# Patient Record
Sex: Female | Born: 1935 | Race: Black or African American | Hispanic: No | Marital: Single | Smoking: Former smoker
Health system: Southern US, Community
[De-identification: ages and names within clinical notes are randomized; demographics above are authoritative.]

## PROBLEM LIST (undated history)

## (undated) DIAGNOSIS — D649 Anemia, unspecified: Secondary | ICD-10-CM

## (undated) DIAGNOSIS — I10 Essential (primary) hypertension: Secondary | ICD-10-CM

## (undated) DIAGNOSIS — E039 Hypothyroidism, unspecified: Secondary | ICD-10-CM

## (undated) DIAGNOSIS — R0602 Shortness of breath: Secondary | ICD-10-CM

## (undated) DIAGNOSIS — K5790 Diverticulosis of intestine, part unspecified, without perforation or abscess without bleeding: Secondary | ICD-10-CM

## (undated) HISTORY — PX: OTHER SURGICAL HISTORY: SHX169

## (undated) HISTORY — DX: Shortness of breath: R06.02

---

## 1984-07-18 HISTORY — PX: ABDOMINAL HYSTERECTOMY: SHX81

## 2003-05-21 HISTORY — PX: OTHER SURGICAL HISTORY: SHX169

## 2010-07-23 ENCOUNTER — Other Ambulatory Visit: Payer: Self-pay | Admitting: Internal Medicine

## 2010-07-23 DIAGNOSIS — Z1231 Encounter for screening mammogram for malignant neoplasm of breast: Secondary | ICD-10-CM

## 2010-08-02 ENCOUNTER — Ambulatory Visit
Admission: RE | Admit: 2010-08-02 | Discharge: 2010-08-02 | Disposition: A | Discharge status: Home / Self Care | Payer: Medicare Other | Source: Ambulatory Visit | Attending: Internal Medicine | Admitting: Internal Medicine

## 2010-08-02 DIAGNOSIS — Z1231 Encounter for screening mammogram for malignant neoplasm of breast: Secondary | ICD-10-CM

## 2010-08-03 ENCOUNTER — Other Ambulatory Visit: Payer: Self-pay | Admitting: Internal Medicine

## 2010-08-03 DIAGNOSIS — R928 Other abnormal and inconclusive findings on diagnostic imaging of breast: Secondary | ICD-10-CM

## 2010-08-14 ENCOUNTER — Ambulatory Visit
Admission: RE | Admit: 2010-08-14 | Discharge: 2010-08-14 | Disposition: A | Discharge status: Home / Self Care | Payer: Medicare Other | Source: Ambulatory Visit | Attending: Internal Medicine | Admitting: Internal Medicine

## 2010-08-14 DIAGNOSIS — R928 Other abnormal and inconclusive findings on diagnostic imaging of breast: Secondary | ICD-10-CM

## 2011-10-11 ENCOUNTER — Encounter (HOSPITAL_COMMUNITY): Payer: Self-pay

## 2011-10-11 ENCOUNTER — Inpatient Hospital Stay (HOSPITAL_COMMUNITY)
Admission: EM | Admit: 2011-10-11 | Discharge: 2011-10-14 | DRG: 378 | Disposition: A | Discharge status: Home / Self Care | Payer: Medicare Other | Attending: Internal Medicine | Admitting: Internal Medicine

## 2011-10-11 ENCOUNTER — Emergency Department (INDEPENDENT_AMBULATORY_CARE_PROVIDER_SITE_OTHER)
Admission: EM | Admit: 2011-10-11 | Discharge: 2011-10-11 | Disposition: A | Discharge status: Other Acute Inpatient Hospital | Payer: Medicare Other | Source: Home / Self Care | Attending: Emergency Medicine | Admitting: Emergency Medicine

## 2011-10-11 ENCOUNTER — Encounter (HOSPITAL_COMMUNITY): Payer: Self-pay | Admitting: *Deleted

## 2011-10-11 DIAGNOSIS — D649 Anemia, unspecified: Secondary | ICD-10-CM

## 2011-10-11 DIAGNOSIS — E039 Hypothyroidism, unspecified: Secondary | ICD-10-CM | POA: Diagnosis present

## 2011-10-11 DIAGNOSIS — E119 Type 2 diabetes mellitus without complications: Secondary | ICD-10-CM | POA: Diagnosis present

## 2011-10-11 DIAGNOSIS — Z860100 Personal history of colon polyps, unspecified: Secondary | ICD-10-CM

## 2011-10-11 DIAGNOSIS — Z6835 Body mass index (BMI) 35.0-35.9, adult: Secondary | ICD-10-CM

## 2011-10-11 DIAGNOSIS — K625 Hemorrhage of anus and rectum: Secondary | ICD-10-CM

## 2011-10-11 DIAGNOSIS — K922 Gastrointestinal hemorrhage, unspecified: Secondary | ICD-10-CM

## 2011-10-11 DIAGNOSIS — D126 Benign neoplasm of colon, unspecified: Secondary | ICD-10-CM | POA: Diagnosis present

## 2011-10-11 DIAGNOSIS — I10 Essential (primary) hypertension: Secondary | ICD-10-CM

## 2011-10-11 DIAGNOSIS — K648 Other hemorrhoids: Secondary | ICD-10-CM | POA: Diagnosis present

## 2011-10-11 DIAGNOSIS — D62 Acute posthemorrhagic anemia: Secondary | ICD-10-CM | POA: Diagnosis present

## 2011-10-11 DIAGNOSIS — K5731 Diverticulosis of large intestine without perforation or abscess with bleeding: Principal | ICD-10-CM | POA: Diagnosis present

## 2011-10-11 DIAGNOSIS — E785 Hyperlipidemia, unspecified: Secondary | ICD-10-CM | POA: Diagnosis present

## 2011-10-11 DIAGNOSIS — Z90710 Acquired absence of both cervix and uterus: Secondary | ICD-10-CM

## 2011-10-11 HISTORY — DX: Anemia, unspecified: D64.9

## 2011-10-11 HISTORY — DX: Diverticulosis of intestine, part unspecified, without perforation or abscess without bleeding: K57.90

## 2011-10-11 HISTORY — DX: Essential (primary) hypertension: I10

## 2011-10-11 LAB — CBC
HCT: 33.6 % — ABNORMAL LOW (ref 36.0–46.0)
Hemoglobin: 10.5 g/dL — ABNORMAL LOW (ref 12.0–15.0)
Hemoglobin: 10.7 g/dL — ABNORMAL LOW (ref 12.0–15.0)
MCH: 28.5 pg (ref 26.0–34.0)
MCV: 88.6 fL (ref 78.0–100.0)
Platelets: 255 10*3/uL (ref 150–400)
RBC: 3.68 MIL/uL — ABNORMAL LOW (ref 3.87–5.11)
RBC: 3.73 MIL/uL — ABNORMAL LOW (ref 3.87–5.11)
RDW: 14.8 % (ref 11.5–15.5)
WBC: 7.6 10*3/uL (ref 4.0–10.5)
WBC: 7.8 10*3/uL (ref 4.0–10.5)

## 2011-10-11 LAB — OCCULT BLOOD, POC DEVICE
Fecal Occult Bld: POSITIVE
Fecal Occult Bld: POSITIVE

## 2011-10-11 LAB — PROTIME-INR: Prothrombin Time: 13.3 seconds (ref 11.6–15.2)

## 2011-10-11 LAB — POCT I-STAT, CHEM 8
BUN: 10 mg/dL (ref 6–23)
Chloride: 110 mEq/L (ref 96–112)
Potassium: 4.3 mEq/L (ref 3.5–5.1)
Sodium: 143 mEq/L (ref 135–145)

## 2011-10-11 LAB — URINALYSIS, ROUTINE W REFLEX MICROSCOPIC
Glucose, UA: NEGATIVE mg/dL
Protein, ur: 100 mg/dL — AB
Specific Gravity, Urine: 1.027 (ref 1.005–1.030)
pH: 5 (ref 5.0–8.0)

## 2011-10-11 LAB — COMPREHENSIVE METABOLIC PANEL
ALT: 10 U/L (ref 0–35)
ALT: 11 U/L (ref 0–35)
AST: 18 U/L (ref 0–37)
Albumin: 3.8 g/dL (ref 3.5–5.2)
Alkaline Phosphatase: 78 U/L (ref 39–117)
CO2: 25 mEq/L (ref 19–32)
Chloride: 106 mEq/L (ref 96–112)
Creatinine, Ser: 1.02 mg/dL (ref 0.50–1.10)
GFR calc non Af Amer: 52 mL/min — ABNORMAL LOW (ref >90)
Potassium: 3.7 mEq/L (ref 3.5–5.1)
Sodium: 139 mEq/L (ref 135–145)
Total Bilirubin: 0.3 mg/dL (ref 0.3–1.2)
Total Protein: 7.6 g/dL (ref 6.0–8.3)

## 2011-10-11 LAB — DIFFERENTIAL
Basophils Absolute: 0 10*3/uL (ref 0.0–0.1)
Lymphocytes Relative: 39 % (ref 12–46)
Monocytes Absolute: 0.7 10*3/uL (ref 0.1–1.0)
Neutro Abs: 3.6 10*3/uL (ref 1.7–7.7)

## 2011-10-11 LAB — ABO/RH: ABO/RH(D): A POS

## 2011-10-11 LAB — URINE MICROSCOPIC-ADD ON

## 2011-10-11 LAB — GLUCOSE, CAPILLARY: Glucose-Capillary: 90 mg/dL (ref 70–99)

## 2011-10-11 LAB — SAMPLE TO BLOOD BANK

## 2011-10-11 LAB — TYPE AND SCREEN

## 2011-10-11 MED ORDER — LEVOTHYROXINE SODIUM 75 MCG PO TABS
75.0000 ug | ORAL_TABLET | Freq: Every day | ORAL | Status: DC
  Administered 2011-10-12 – 2011-10-14 (×3): 75 ug via ORAL
  Filled 2011-10-11 (×3): qty 1

## 2011-10-11 MED ORDER — SODIUM CHLORIDE 0.9 % IV SOLN
INTRAVENOUS | Status: AC
  Administered 2011-10-11: via INTRAVENOUS

## 2011-10-11 MED ORDER — ATORVASTATIN CALCIUM 40 MG PO TABS
40.0000 mg | ORAL_TABLET | Freq: Every day | ORAL | Status: DC
  Filled 2011-10-11 (×3): qty 1

## 2011-10-11 MED ORDER — CARVEDILOL 12.5 MG PO TABS
12.5000 mg | ORAL_TABLET | Freq: Two times a day (BID) | ORAL | Status: DC
  Filled 2011-10-11 (×3): qty 1

## 2011-10-11 MED ORDER — ONDANSETRON HCL 4 MG/2ML IJ SOLN: 4.0000 mg | Freq: Four times a day (QID) | INTRAMUSCULAR | Status: DC | PRN

## 2011-10-11 MED ORDER — SODIUM CHLORIDE 0.9 % IJ SOLN
3.0000 mL | Freq: Two times a day (BID) | INTRAMUSCULAR | Status: DC
  Administered 2011-10-12: 3 mL via INTRAVENOUS

## 2011-10-11 MED ORDER — HYDROCODONE-ACETAMINOPHEN 5-325 MG PO TABS: 1.0000 | ORAL_TABLET | ORAL | Status: DC | PRN

## 2011-10-11 MED ORDER — INSULIN ASPART 100 UNIT/ML ~~LOC~~ SOLN: 0.0000 [IU] | SUBCUTANEOUS | Status: DC

## 2011-10-11 MED ORDER — ACETAMINOPHEN 650 MG RE SUPP: 650.0000 mg | Freq: Four times a day (QID) | RECTAL | Status: DC | PRN

## 2011-10-11 MED ORDER — ACETAMINOPHEN 325 MG PO TABS: 650.0000 mg | ORAL_TABLET | Freq: Four times a day (QID) | ORAL | Status: DC | PRN

## 2011-10-11 MED ORDER — ONDANSETRON HCL 4 MG PO TABS: 4.0000 mg | ORAL_TABLET | Freq: Four times a day (QID) | ORAL | Status: DC | PRN

## 2011-10-11 MED ORDER — GUAIFENESIN-DM 100-10 MG/5ML PO SYRP
5.0000 mL | ORAL_SOLUTION | ORAL | Status: DC | PRN
  Filled 2011-10-11: qty 5

## 2011-10-11 MED ORDER — ALBUTEROL SULFATE (5 MG/ML) 0.5% IN NEBU: 2.5000 mg | INHALATION_SOLUTION | RESPIRATORY_TRACT | Status: DC | PRN

## 2011-10-11 MED ORDER — PANTOPRAZOLE SODIUM 40 MG IV SOLR
40.0000 mg | Freq: Every day | INTRAVENOUS | Status: DC
  Administered 2011-10-12: 40 mg via INTRAVENOUS
  Filled 2011-10-11 (×3): qty 40

## 2011-10-11 NOTE — ED Notes (Signed)
PT reports bright red blood in stool after eatting spicy food. No other complaints.

## 2011-10-11 NOTE — ED Provider Notes (Signed)
I saw and evaluated the patient, reviewed the resident's note and I agree with the findings and plan.  Pt seen and evaluated, hx of diverticulosis with bright red blood per rectum- she will be admitted to stepdown bed, [facility] has seen in the ED  Threasa Beards, MD 10/11/11 2242

## 2011-10-11 NOTE — ED Provider Notes (Signed)
Chief Complaint  Patient presents with  . Rectal Bleeding    History of Present Illness:   The patient is a 78 year old female who has had a three-day history of passage of large amounts of bright red blood per rectum. The blood is mixed with the stool. Stools have been somewhat loose and dark. She estimates having passed 1 pint of blood in total. She denies any rectal pain, itching, or irritation. She has not had abdominal pain, fever, chills, nausea, or vomiting. She feels a little bit weak and tired and rundown and mildly short of breath. She has a history of diverticulosis in the past but no history of diverticulitis or diverticular bleeding. She had a colonoscopy in 2007 in [place] which was normal except for diverticulosis. She has diabetes and hypothyroidism. She lives alone. Her nearest relative is her daughter who lives out of town. She has no history of ulcer disease or GI bleeding in the past.  Review of Systems:  Other than noted above, the patient denies any of the following symptoms: Constitutional:  No fever, chills, fatigue, weight loss or anorexia. Lungs:  No cough or shortness of breath. Heart:  No chest pain, palpitations, syncope or edema.  No cardiac history. Abdomen:  No nausea, vomiting, hematememesis, melena, diarrhea, or hematochezia. GU:  No dysuria, frequency, urgency, or hematuria. Gyn:  No vaginal discharge, itching, abnormal bleeding or pelvic pain. Skin:  No rash or itching.  [place]:  Past medical history, family history, social history, meds, and allergies were reviewed.  No prior abdominal surgeries, past history of GI problems, STDs or GYN problems.  No history of aspirin or NSAID use.  No excessive alcohol intake.  Physical Exam:   Vital signs:  BP 122/64  Pulse 108  Temp(Src) 98.2 F (36.8 C) (Oral)  Resp 12  SpO2 98% Filed Vitals:   10/11/11 1532 10/11/11 1731 10/11/11 1732 10/11/11 1733  BP: 153/83 127/85 supine 122/80 sitting 122/64 standing    Pulse: 103 80 87 108  Temp: 98.2 F (36.8 C)     TempSrc: Oral     Resp: 12     SpO2: 98%      Gen:  Alert, oriented, in no distress. Lungs:  Breath sounds clear and equal bilaterally.  No wheezes, rales or rhonchi. Heart:  Regular rhythm.  No gallops or murmers.   Abdomen:  Abdomen is soft, flat, and nondistended. There is no tenderness, guarding, rebound. No organomegaly or mass. No pulsatile midline abdominal mass or bruit. Bowel sounds are normal. Skin:  Clear, warm and dry.  No rash.  Procedure Note:  Verbal informed consent was obtained from the patient.  Risks and benefits were outlined with the patient.  Patient understands and accepts these risks.  Identity of the patient was confirmed verbally and by armband.    Procedure was performed as followed:  Endoscopy was done with the patient in the right lateral decubitus position. She had some external hemorrhoid tags. In scope was inserted to the depth of 1 inch. She had no hemorrhoids or fissures. There was a large amount of bleeding in the rectal ampulla with dark, red, clotted blood. Digital rectal exam reveals no pain, no masses, and gross blood on the exam glove which was heme positive.  Patient tolerated the procedure well without any immediate complications.   Labs:   Results for orders placed during the hospital encounter of 10/11/11  OCCULT BLOOD, POC DEVICE      Component Value Range   Fecal  Occult Bld POSITIVE    POCT I-STAT, CHEM 8      Component Value Range   Sodium 143  135 - 145 (mEq/L)   Potassium 4.3  3.5 - 5.1 (mEq/L)   Chloride 110  96 - 112 (mEq/L)   BUN 10  6 - 23 (mg/dL)   Creatinine, Ser 1.10  0.50 - 1.10 (mg/dL)   Glucose, Bld 98  70 - 99 (mg/dL)   Calcium, Ion 1.53 (*) 1.12 - 1.32 (mmol/L)   TCO2 25  0 - 100 (mmol/L)   Hemoglobin 13.3  12.0 - 15.0 (g/dL)   HCT 39.0  36.0 - 46.0 (%)    Assessment:  The encounter diagnosis was Lower GI bleed. This is probably diverticular bleeding.   Plan:   1.   The following meds were prescribed:   New Prescriptions   No medications on file   2.  The patient was transported to the emergency department via shuttle.  Harden Mo, MD 10/11/11 [phone]

## 2011-10-11 NOTE — ED Notes (Signed)
C/o 2-3 day duration of painless bleeding w loose stools; reported colon polyp history from MD in [place] a few years ago; NAD, anxious

## 2011-10-11 NOTE — ED Provider Notes (Signed)
History     CSN: [id number]  Arrival date & time 10/11/11  1801   First MD Initiated Contact with Patient 10/11/11 1930      Chief Complaint  Patient presents with  . Rectal Bleeding    (Consider location/radiation/quality/duration/timing/severity/associated sxs/prior treatment) HPI Comments: Blood in stool since yesterday.  Sent from urgent care for eval.  On ASA.  No lightheadedness.  Otherwise doing well.  Patient is a 78 y.o. female presenting with hematochezia. The history is provided by the patient.  Rectal Bleeding  The current episode started yesterday. The onset was gradual. The problem occurs continuously. The problem has been unchanged. The patient is experiencing no pain. The stool is described as bloody, mixed with blood and soft. There was no prior successful therapy. There was no prior unsuccessful therapy. Pertinent negatives include no abdominal pain, no hemorrhoids, no nausea, no rectal pain, no vomiting, no chest pain and no rash. She has been behaving normally. She has been eating and drinking normally.    Past Medical History  Diagnosis Date  . Hypertension   . Diabetes mellitus   . Diverticulosis   . Anemia     Past Surgical History  Procedure Date  . Abdominal hysterectomy     Family History  Problem Relation Age of Onset  . Cancer Mother   . Diabetes Maternal Uncle     History  Substance Use Topics  . Smoking status: Never Smoker   . Smokeless tobacco: Not on file  . Alcohol Use: No    OB History    Grav Para Term Preterm Abortions TAB SAB Ect Mult Living                  Review of Systems  Constitutional: Negative for activity change.  HENT: Negative for congestion.   Eyes: Negative for visual disturbance.  Respiratory: Negative for chest tightness and shortness of breath.   Cardiovascular: Negative for chest pain and leg swelling.  Gastrointestinal: Positive for hematochezia. Negative for nausea, vomiting, abdominal pain, rectal  pain and hemorrhoids.  Genitourinary: Negative for dysuria.  Skin: Negative for rash.  Neurological: Negative for syncope.  Psychiatric/Behavioral: Negative for behavioral problems.    Allergies  Review of patient's allergies indicates no known allergies.  Home Medications   No current outpatient prescriptions on file.  BP 154/73  Pulse 79  Temp(Src) 97.9 F (36.6 C) (Oral)  Resp 20  SpO2 95%  Physical Exam  Constitutional: She is oriented to person, place, and time. She appears well-developed and well-nourished.  HENT:  Head: Normocephalic and atraumatic.  Eyes: Conjunctivae and EOM are normal. Pupils are equal, round, and reactive to light. No scleral icterus.  Neck: Normal range of motion. Neck supple.  Cardiovascular: Normal rate and regular rhythm.  Exam reveals no gallop and no friction rub.   No murmur heard. Pulmonary/Chest: Effort normal and breath sounds normal. No respiratory distress. She has no wheezes. She has no rales. She exhibits no tenderness.  Abdominal: Soft. She exhibits no distension and no mass. There is no tenderness. There is no rebound and no guarding.  Genitourinary:       Bright blood with stool.  No pain.  No hemorrhoids.  Musculoskeletal: Normal range of motion.  Neurological: She is alert and oriented to person, place, and time. She has normal reflexes. No cranial nerve deficit.  Skin: Skin is warm and dry. No rash noted.  Psychiatric: She has a normal mood and affect. Her behavior is normal. Judgment  and thought content normal.    ED Course  Procedures (including critical care time)  Labs Reviewed  URINALYSIS, ROUTINE W REFLEX MICROSCOPIC - Abnormal; Notable for the following:    Bilirubin Urine SMALL (*)    Ketones, ur 15 (*)    Protein, ur 100 (*)    Leukocytes, UA SMALL (*)    All other components within normal limits  CBC - Abnormal; Notable for the following:    RBC 3.73 (*)    Hemoglobin 10.7 (*) DELTA CHECK NOTED   HCT 33.6  (*)    All other components within normal limits  COMPREHENSIVE METABOLIC PANEL - Abnormal; Notable for the following:    Calcium 10.7 (*)    GFR calc non Af Amer 52 (*)    GFR calc Af Amer 61 (*)    All other components within normal limits  URINE MICROSCOPIC-ADD ON - Abnormal; Notable for the following:    Casts HYALINE CASTS (*) GRANULAR CAST   All other components within normal limits  COMPREHENSIVE METABOLIC PANEL - Abnormal; Notable for the following:    Total Bilirubin 0.2 (*)    GFR calc non Af Amer 52 (*)    GFR calc Af Amer 60 (*)    All other components within normal limits  DIFFERENTIAL  SAMPLE TO BLOOD BANK  LACTIC ACID, PLASMA  TYPE AND SCREEN  PROTIME-INR  OCCULT BLOOD, POC DEVICE  ABO/RH   No results found.   1. Rectal bleed   2. Anemia       MDM  Blood in stool since yesterday.  Sent from urgent care for eval.  On ASA.  No lightheadedness.  Otherwise doing well.  VSS and well appearing.  BRBPR on exam.  No abdominal pain.  Exam suggestive of lower GI bleed.  D/w medicine - will admit for further management.        Dyke Brackett, MD 10/11/11 2238

## 2011-10-11 NOTE — Discharge Instructions (Signed)
We have determined that your problem requires further evaluation in the emergency department.  We will take care of your transport there.  Once at the emergency department, you will be evaluated by a provider and they will order whatever treatment or tests they deem necessary.  We cannot guarantee that they will do any specific test or do any specific treatment.    Gastrointestinal Bleeding Gastrointestinal (GI) bleeding is bleeding from the gut or any place between your mouth and anus. If bleeding is slow, you may be allowed to go home. If there is a lot of bleeding, hospitalization and observation are often required. SYMPTOMS   You vomit bright red blood or material that looks like coffee grounds.   You have blood in your stools or the stools look black and tarry.  DIAGNOSIS  Your caregiver may diagnose your condition by taking a history and a physical exam. More tests may be needed, including:  X-rays.   EGD (esophagogastroduodenoscopy), which looks at your esophagus, stomach, and small bowel through a flexible telescope-like instrument.   Colonoscopy, which looks at your colon/large bowel through a flexible telescope-like instrument.   Biopsies, which remove a small sample of tissue to examine under a microscope.  Finding out the results of your test Not all test results are available during your visit. If your test results are not back during the visit, make an appointment with your caregiver to find out the results. Do not assume everything is normal if you have not heard from your caregiver or the medical facility. It is important for you to follow up on all of your test results. HOME CARE INSTRUCTIONS   Follow instructions as suggested by your caregiver regarding medicines. Do not take aspirin, drink alcohol, or take medicines for pain and arthritis unless your caregiver says it is okay.   Get the suggested follow-up care when the tests are done.  SEEK IMMEDIATE MEDICAL CARE IF:     Your bleeding increases or you become lightheaded, weak, or pass out (faint).   You experience severe cramps in your stomach, back, or belly (abdomen).   You pass large clots.   The problems which brought you in for medical care get worse.  MAKE SURE YOU:   Understand these instructions.   Will watch your condition.   Will get help right away if you are not doing well or get worse.  Document Released: 05/03/2000 Document Revised: 04/25/2011 Document Reviewed: 04/15/2011 [facility] Patient Information 2012 [place], [place].

## 2011-10-11 NOTE — ED Notes (Signed)
The pt was a ucc transfer.  No history of hemorrhoids.  The blood is mixed in with the stools

## 2011-10-11 NOTE — H&P (Signed)
PCP:   Foye Spurling, MD, MD    Chief Complaint:   Bloody stools  HPI: Alexa Lawson is a 78 y.o. female   has a past medical history of Hypertension; Diabetes mellitus; Diverticulosis; and Anemia.   Presented with  Since Wednesday she had blood in stool. She had 1 episode on Wednesday 2 yesterday and again an other epiosode today. She went  To urgent care and was found to be anemic and tachycardic and sent to ER. While at urgent care the hemoglobin went from 13.3 to 10.7.  Last colonoscopy was in 2007 done in [place] showed diverticulosis and some polyps that were removed. She was supposed to come back next year but never did. Patient reports No abdominal pain, no epigastric pain. She has been taking full dose aspirin but not sure why. No history of melena.  Review of Systems:    Pertinent positives include: occasional dyspnea on exertion, blood in stool,  Constitutional:  No weight loss, night sweats, Fevers, chills, fatigue, weight loss  HEENT:  No headaches, Difficulty swallowing,Tooth/dental problems,Sore throat,  No sneezing, itching, ear ache, nasal congestion, post nasal drip,  Cardio-vascular:  No chest pain, Orthopnea, PND, anasarca, dizziness, palpitations.no Bilateral lower extremity swelling  GI:  No heartburn, indigestion, abdominal pain, nausea, vomiting, diarrhea, change in bowel habits, loss of appetite, melena,  hematemesis Resp: no shortness of breath at rest. No , No excess mucus, no productive cough, No non-productive cough, No coughing up of blood.No change in color of mucus.No wheezing. Skin:  no rash or lesions. No jaundice GU:  no dysuria, change in color of urine, no urgency or frequency. No straining to urinate.  No flank pain.  Musculoskeletal:  No joint pain or no joint swelling. No decreased range of motion. No back pain.  Psych:  No change in mood or affect. No depression or anxiety. No memory loss.  Neuro: no localizing neurological  complaints, no tingling, no weakness, no double vision, no gait abnormality, no slurred speech, no confusion  Otherwise ROS are negative except for above, 10 systems were reviewed  Past Medical History: Past Medical History  Diagnosis Date  . Hypertension   . Diabetes mellitus   . Diverticulosis   . Anemia    Past Surgical History  Procedure Date  . Abdominal hysterectomy      Medications: Prior to Admission medications   Medication Sig Start Date End Date Taking? Authorizing Provider  amLODipine-valsartan (EXFORGE) 5-320 MG per tablet Take 1 tablet by mouth daily.   Yes Historical Provider, MD  aspirin 325 MG EC tablet Take 325 mg by mouth daily.   Yes Historical Provider, MD  carvedilol (COREG) 12.5 MG tablet Take 12.5 mg by mouth 2 (two) times daily with a meal.   Yes Historical Provider, MD  levothyroxine (SYNTHROID, LEVOTHROID) 75 MCG tablet Take 75 mcg by mouth daily.   Yes Historical Provider, MD  metFORMIN (GLUCOPHAGE) 1000 MG tablet Take 1,000 mg by mouth daily with breakfast.    Yes Historical Provider, MD  rosuvastatin (CRESTOR) 20 MG tablet Take 20 mg by mouth daily.   Yes Historical Provider, MD  Vitamin D, Ergocalciferol, (DRISDOL) 50000 UNITS CAPS Take 50,000 Units by mouth every 14 (fourteen) days.    Yes Historical Provider, MD    Allergies:  No Known Allergies  Social History:  Ambulatory  independently Lives at  Home alone   reports that she has never smoked. She does not have any smokeless tobacco history on file. She reports  that she does not drink alcohol or use illicit drugs.   Family History: family history includes Cancer in her mother and Diabetes in her maternal uncle.    Physical Exam: Patient Vitals for the past 24 hrs:  BP Temp Temp src Pulse Resp SpO2  10/11/11 2015 149/86 mmHg - - 84  - 96 %  10/11/11 1945 144/71 mmHg - - 90  - 95 %  10/11/11 1804 155/70 mmHg 97.9 F (36.6 C) Oral 88  20  100 %    1. General:  in No Acute  distress 2. Psychological: Alert and  Oriented 3. Head/ENT:    Dry Mucous Membranes                          Head Non traumatic, neck supple                          Normal Dentition 4. SKIN: normal  Skin turgor,  Skin clean Dry and intact no rash 5. Heart: Regular rate and rhythm no Murmur, Rub or gallop 6. Lungs: Clear to auscultation bilaterally, no wheezes or crackles   7. Abdomen: Soft, non-tender, Non distended slightly obese 8. Lower extremities: no clubbing, cyanosis, or edema 9. Neurologically Grossly intact, moving all 4 extremities equally 10. MSK: Normal range of motion  body mass index is unknown because there is no height or weight on file.   Labs on Admission:   [facility] 10/11/11 1956 10/11/11 1846  NA 139 139  K 3.7 3.9  CL 106 106  CO2 23 25  GLUCOSE 89 91  BUN 9 9  CREATININE 1.03 1.02  CALCIUM 10.3 10.7*  MG -- --  PHOS -- --    Basename 10/11/11 1956 10/11/11 1846  AST 18 18  ALT 11 10  ALKPHOS 78 82  BILITOT 0.2* 0.3  PROT 7.6 7.8  ALBUMIN 3.8 4.0   No results found for this basename: LIPASE:2,AMYLASE:2 in the last 72 hours  Basename 10/11/11 1846 10/11/11 1734  WBC 7.6 --  NEUTROABS 3.6 --  HGB 10.7* 13.3  HCT 33.6* 39.0  MCV 90.1 --  PLT 275 --   No results found for this basename: CKTOTAL:3,CKMB:3,CKMBINDEX:3,TROPONINI:3 in the last 72 hours No results found for this basename: TSH,T4TOTAL,FREET3,T3FREE,THYROIDAB in the last 72 hours No results found for this basename: VITAMINB12:2,FOLATE:2,FERRITIN:2,TIBC:2,IRON:2,RETICCTPCT:2 in the last 72 hours No results found for this basename: HGBA1C    CrCl is unknown because there is no height on file for the current visit. ABG    Component Value Date/Time   TCO2 25 10/11/2011 1734     Lactic Acid, Venous 1.5   Cultures: No results found for this basename: sdes, specrequest, cult, reptstatus       Radiological Exams on Admission: No results found.  Assessment/Plan  This is a  78 year old female with likely lower GI bleeding. Although she has been taking full dose aspirin but her story is not consistent upper GI source  Present on Admission:  .Blood in stool - we'll admit to step down monitor carefully obtain serial CBCs, type and screen already ordered by ER. Have spoken to Dr. Collene Mares GI will come and see the patient tomorrow meanwhile clear liquids make n.p.o. post in case she needs any studies  .HTN (hypertension) - will hold Norvasc but continue carvediolol .Anemia -  the patient states she had history of anemia in the past but this was never worked  up. Initial  hemoglobin today was 13 I'm not sure how accurate it is the repeat hemoglobin just a few hours was significantly lower   diabetes  - will hold metformin and put on sliding scale    Prophylaxis: SCD Protonix  CODE STATUS: FULL CODE  I have spent a total of 65 min on this admission, time taken speaking to consultants   [place] 10/11/2011, 8:43 PM

## 2011-10-11 NOTE — ED Notes (Signed)
The pt has had some bright red blood in her stools since Wednesday night.  No pain.  No previous hisotry

## 2011-10-12 DIAGNOSIS — D649 Anemia, unspecified: Secondary | ICD-10-CM

## 2011-10-12 DIAGNOSIS — E119 Type 2 diabetes mellitus without complications: Secondary | ICD-10-CM

## 2011-10-12 DIAGNOSIS — K922 Gastrointestinal hemorrhage, unspecified: Secondary | ICD-10-CM

## 2011-10-12 LAB — CBC
HCT: 32 % — ABNORMAL LOW (ref 36.0–46.0)
HCT: 31.5 % — ABNORMAL LOW (ref 36.0–46.0)
HCT: 31.7 % — ABNORMAL LOW (ref 36.0–46.0)
Hemoglobin: 10.2 g/dL — ABNORMAL LOW (ref 12.0–15.0)
Hemoglobin: 10.1 g/dL — ABNORMAL LOW (ref 12.0–15.0)
Hemoglobin: 10.2 g/dL — ABNORMAL LOW (ref 12.0–15.0)
MCH: 28.7 pg (ref 26.0–34.0)
MCHC: 31.9 g/dL (ref 30.0–36.0)
MCV: 89.3 fL (ref 78.0–100.0)
RBC: 3.52 MIL/uL — ABNORMAL LOW (ref 3.87–5.11)
RBC: 3.55 MIL/uL — ABNORMAL LOW (ref 3.87–5.11)
RDW: 14.7 % (ref 11.5–15.5)
WBC: 7.8 10*3/uL (ref 4.0–10.5)
WBC: 7.6 10*3/uL (ref 4.0–10.5)

## 2011-10-12 LAB — GLUCOSE, CAPILLARY
Glucose-Capillary: 90 mg/dL (ref 70–99)
Glucose-Capillary: 89 mg/dL (ref 70–99)
Glucose-Capillary: 85 mg/dL (ref 70–99)

## 2011-10-12 LAB — COMPREHENSIVE METABOLIC PANEL
BUN: 9 mg/dL (ref 6–23)
CO2: 22 mEq/L (ref 19–32)
Calcium: 10.1 mg/dL (ref 8.4–10.5)
Creatinine, Ser: 0.91 mg/dL (ref 0.50–1.10)
GFR calc Af Amer: 70 mL/min — ABNORMAL LOW (ref >90)
GFR calc non Af Amer: 60 mL/min — ABNORMAL LOW (ref >90)
Glucose, Bld: 86 mg/dL (ref 70–99)
Total Protein: 6.8 g/dL (ref 6.0–8.3)

## 2011-10-12 LAB — HEMOGLOBIN A1C
Hgb A1c MFr Bld: 6.3 % — ABNORMAL HIGH (ref <5.7)
Mean Plasma Glucose: 134 mg/dL — ABNORMAL HIGH (ref <117)

## 2011-10-12 LAB — IRON AND TIBC
Iron: 67 ug/dL (ref 42–135)
Saturation Ratios: 26 % (ref 20–55)
TIBC: 260 ug/dL (ref 250–470)

## 2011-10-12 LAB — VITAMIN B12: Vitamin B-12: 603 pg/mL (ref 211–911)

## 2011-10-12 LAB — MRSA PCR SCREENING: MRSA by PCR: NEGATIVE

## 2011-10-12 LAB — RETICULOCYTES: Retic Ct Pct: 1.5 % (ref 0.4–3.1)

## 2011-10-12 MED ORDER — PEG 3350-KCL-NA BICARB-NACL 420 G PO SOLR
4000.0000 mL | Freq: Once | ORAL | Status: AC
  Administered 2011-10-12: 4000 mL via ORAL
  Filled 2011-10-12: qty 4000

## 2011-10-12 MED ORDER — SODIUM CHLORIDE 0.9 % IV SOLN
INTRAVENOUS | Status: DC
  Administered 2011-10-12 – 2011-10-13 (×2): via INTRAVENOUS

## 2011-10-12 MED ORDER — CARVEDILOL 12.5 MG PO TABS
12.5000 mg | ORAL_TABLET | Freq: Two times a day (BID) | ORAL | Status: DC
  Administered 2011-10-12 – 2011-10-14 (×4): 12.5 mg via ORAL
  Filled 2011-10-12 (×6): qty 1

## 2011-10-12 NOTE — Progress Notes (Signed)
[facility]  Consultants Dr Collene Mares- GI  Subjective: Has had one more bloody BM this AM. No dizziness on getting up. No abdominal pain.   Objective: Blood pressure 138/60, pulse 74, temperature 97.6 F (36.4 C), temperature source Oral, resp. rate 20, height _0  (1.702 m), weight 101.4 kg (223 lb 8.7 oz), SpO2 95.00%. Weight change:   Intake/Output Summary (Last 24 hours) at 10/12/11 1222 Last data filed at 10/12/11 0600  Gross per 24 hour  Intake    855 ml  Output    400 ml  Net    455 ml    Physical Exam: General appearance: alert, cooperative and no distress Throat: lips, mucosa, and tongue normal; teeth and gums normal Lungs: clear to auscultation bilaterally Heart: regular rate and rhythm, S1, S2 normal, no murmur, click, rub or gallop Abdomen: soft, non-tender; bowel sounds normal; no masses,  no organomegaly Extremities: extremities normal, atraumatic, no cyanosis or edema  Lab Results:  [facility] 10/12/11 0443 10/11/11 1956  NA 141 139  K 4.1 3.7  CL 109 106  CO2 22 23  GLUCOSE 86 89  BUN 9 9  CREATININE 0.91 1.03  CALCIUM 10.1 10.3  MG 1.8 --  PHOS 2.8 --    Basename 10/12/11 0443 10/11/11 1956  AST 17 18  ALT 10 11  ALKPHOS 73 78  BILITOT 0.4 0.2*  PROT 6.8 7.6  ALBUMIN 3.4* 3.8   No results found for this basename: LIPASE:2,AMYLASE:2 in the last 72 hours  Basename 10/12/11 0443 10/11/11 2336 10/11/11 1846  WBC 7.8 7.8 --  NEUTROABS -- -- 3.6  HGB 10.2* 10.5* --  HCT 32.0* 32.6* --  MCV 89.6 88.6 --  PLT 259 255 --   No results found for this basename: CKTOTAL:3,CKMB:3,CKMBINDEX:3,TROPONINI:3 in the last 72 hours No components found with this basename: POCBNP:3 No results found for this basename: DDIMER:2 in the last 72 hours No results found for this basename: HGBA1C:2 in the last 72 hours No results found for this basename: CHOL:2,HDL:2,LDLCALC:2,TRIG:2,CHOLHDL:2,LDLDIRECT:2 in the last 72 hours No results found for this basename:  TSH,T4TOTAL,FREET3,T3FREE,THYROIDAB in the last 72 hours  Basename 10/12/11 0443  VITAMINB12 --  FOLATE --  FERRITIN --  TIBC 260  IRON 67  RETICCTPCT 1.5    Micro Results: Recent Results (from the past 240 hour(s))  MRSA PCR SCREENING     Status: Normal   Collection Time   10/11/11 10:42 PM      Component Value Range Status Comment   MRSA by PCR NEGATIVE  NEGATIVE  Final     Studies/Results: No results found.  Medications: Scheduled Meds:    . atorvastatin  40 mg Oral q1800  . carvedilol  12.5 mg Oral BID WC  . insulin aspart  0-9 Units Subcutaneous Q4H  . levothyroxine  75 mcg Oral Daily  . pantoprazole (PROTONIX) IV  40 mg Intravenous QHS  . sodium chloride  3 mL Intravenous Q12H  . DISCONTD: carvedilol  12.5 mg Oral BID WC   Continuous Infusions:    . sodium chloride 100 mL/hr at 10/11/11 2351  . sodium chloride 125 mL/hr (10/12/11 0945)   PRN Meds:.acetaminophen, acetaminophen, albuterol, guaiFENesin-dextromethorphan, HYDROcodone-acetaminophen, ondansetron (ZOFRAN) IV, ondansetron  Assessment/Plan:  GI bleed Likely lower GI.  Awaiting GI eval.  Takes full dose ASA for 10 yrs for no specific reason. This has been held.   Diabetes mellitus Currently controlled   HTN (hypertension) Cont meds with holding parameters   Anemia Hgb dropped from 13.3  on admission to 10- holding at 10 currently. Cont to follow hgb Q6 hrs and transfuse if < 8 or symptomatic.   Code Status: Full code Disposition: cont to watch in SDU [place] 10/12/2011, 12:22 PM  LOS: 1 day

## 2011-10-12 NOTE — Consult Note (Signed)
Reason for Consult:Rectal bleeding. Referring Physician: [facility] CROSS COVER FOR [facility] Alexa Lawson is an 78 y.o. female.  HPI: Patient was admitted yesterday for rectal bleeding that she has been having for the last 4 days. She denies having any abdominal pain, nausea, vomiting, fever, chills or rigors. She has a colonoscopy in 2007, when she lived in [place] and reportedly was noted to have diverticulosis and had polyps removed. She tells me that her gastroenterologist asked her to come back in 1 year but she never did. She has had no GI issues till this week. She has mild constipation and takes an OTC laxative as needed. There is no history of dysphagia, odynophagia, ulcers, jaundice or colitis.   Past Medical History  Diagnosis Date  . Hypertension   . Diabetes mellitus   . Diverticulosis   . Anemia     Past Surgical History  Procedure Date  . Abdominal hysterectomy     Family History  Problem Relation Age of Onset  . Cancer Mother   . Diabetes Maternal Uncle     Social History:  reports that she has never smoked. She does not have any smokeless tobacco history on file. She reports that she does not drink alcohol or use illicit drugs.  Allergies: No Known Allergies  Medications: I have reviewed the patient's current medications.  Results for orders placed during the hospital encounter of 10/11/11 (from the past 48 hour(s))  URINALYSIS, ROUTINE W REFLEX MICROSCOPIC     Status: Abnormal   Collection Time   10/11/11  6:07 PM      Component Value Range Comment   Color, Urine YELLOW  YELLOW     APPearance CLEAR  CLEAR     Specific Gravity, Urine 1.027  1.005 - 1.030     pH 5.0  5.0 - 8.0     Glucose, UA NEGATIVE  NEGATIVE (mg/dL)    Hgb urine dipstick NEGATIVE  NEGATIVE     Bilirubin Urine SMALL (*) NEGATIVE     Ketones, ur 15 (*) NEGATIVE (mg/dL)    Protein, ur 100 (*) NEGATIVE (mg/dL)    Urobilinogen, UA 1.0  0.0 - 1.0 (mg/dL)    Nitrite NEGATIVE  NEGATIVE     Leukocytes, UA SMALL (*) NEGATIVE    URINE MICROSCOPIC-ADD ON     Status: Abnormal   Collection Time   10/11/11  6:07 PM      Component Value Range Comment   Squamous Epithelial / LPF RARE  RARE     WBC, UA 3-6  <3 (WBC/hpf)    RBC / HPF 0-2  <3 (RBC/hpf)    Bacteria, UA RARE  RARE     Casts HYALINE CASTS (*) NEGATIVE  GRANULAR CAST   Urine-Other MUCOUS PRESENT     CBC     Status: Abnormal   Collection Time   10/11/11  6:46 PM      Component Value Range Comment   WBC 7.6  4.0 - 10.5 (K/uL)    RBC 3.73 (*) 3.87 - 5.11 (MIL/uL)    Hemoglobin 10.7 (*) 12.0 - 15.0 (g/dL) DELTA CHECK NOTED   HCT 33.6 (*) 36.0 - 46.0 (%)    MCV 90.1  78.0 - 100.0 (fL)    MCH 28.7  26.0 - 34.0 (pg)    MCHC 31.8  30.0 - 36.0 (g/dL)    RDW 14.8  11.5 - 15.5 (%)    Platelets 275  150 - 400 (K/uL)   DIFFERENTIAL  Status: Normal   Collection Time   10/11/11  6:46 PM      Component Value Range Comment   Neutrophils Relative 48  43 - 77 (%)    Neutro Abs 3.6  1.7 - 7.7 (K/uL)    Lymphocytes Relative 39  12 - 46 (%)    Lymphs Abs 3.0  0.7 - 4.0 (K/uL)    Monocytes Relative 9  3 - 12 (%)    Monocytes Absolute 0.7  0.1 - 1.0 (K/uL)    Eosinophils Relative 4  0 - 5 (%)    Eosinophils Absolute 0.3  0.0 - 0.7 (K/uL)    Basophils Relative 0  0 - 1 (%)    Basophils Absolute 0.0  0.0 - 0.1 (K/uL)   COMPREHENSIVE METABOLIC PANEL     Status: Abnormal   Collection Time   10/11/11  6:46 PM      Component Value Range Comment   Sodium 139  135 - 145 (mEq/L)    Potassium 3.9  3.5 - 5.1 (mEq/L)    Chloride 106  96 - 112 (mEq/L)    CO2 25  19 - 32 (mEq/L)    Glucose, Bld 91  70 - 99 (mg/dL)    BUN 9  6 - 23 (mg/dL)    Creatinine, Ser 1.02  0.50 - 1.10 (mg/dL)    Calcium 10.7 (*) 8.4 - 10.5 (mg/dL)    Total Protein 7.8  6.0 - 8.3 (g/dL)    Albumin 4.0  3.5 - 5.2 (g/dL)    AST 18  0 - 37 (U/L)    ALT 10  0 - 35 (U/L)    Alkaline Phosphatase 82  39 - 117 (U/L)    Total Bilirubin 0.3  0.3 - 1.2 (mg/dL)    GFR calc  non Af Amer 52 (*) >90 (mL/min)    GFR calc Af Amer 61 (*) >90 (mL/min)   SAMPLE TO BLOOD BANK     Status: Normal   Collection Time   10/11/11  6:49 PM      Component Value Range Comment   Blood Bank Specimen SAMPLE AVAILABLE FOR TESTING      Sample Expiration 10/12/2011     TYPE AND SCREEN     Status: Normal   Collection Time   10/11/11  6:49 PM      Component Value Range Comment   ABO/RH(D) A POS      Antibody Screen NEG      Sample Expiration 10/14/2011     ABO/RH     Status: Normal   Collection Time   10/11/11  6:49 PM      Component Value Range Comment   ABO/RH(D) A POS     OCCULT BLOOD, POC DEVICE     Status: Normal   Collection Time   10/11/11  7:39 PM      Component Value Range Comment   Fecal Occult Bld POSITIVE     LACTIC ACID, PLASMA     Status: Normal   Collection Time   10/11/11  7:55 PM      Component Value Range Comment   Lactic Acid, Venous 1.5  0.5 - 2.2 (mmol/L)   COMPREHENSIVE METABOLIC PANEL     Status: Abnormal   Collection Time   10/11/11  7:56 PM      Component Value Range Comment   Sodium 139  135 - 145 (mEq/L)    Potassium 3.7  3.5 - 5.1 (mEq/L)    Chloride 106  96 - 112 (mEq/L)    CO2 23  19 - 32 (mEq/L)    Glucose, Bld 89  70 - 99 (mg/dL)    BUN 9  6 - 23 (mg/dL)    Creatinine, Ser 1.03  0.50 - 1.10 (mg/dL)    Calcium 10.3  8.4 - 10.5 (mg/dL)    Total Protein 7.6  6.0 - 8.3 (g/dL)    Albumin 3.8  3.5 - 5.2 (g/dL)    AST 18  0 - 37 (U/L)    ALT 11  0 - 35 (U/L)    Alkaline Phosphatase 78  39 - 117 (U/L)    Total Bilirubin 0.2 (*) 0.3 - 1.2 (mg/dL)    GFR calc non Af Amer 52 (*) >90 (mL/min)    GFR calc Af Amer 60 (*) >90 (mL/min)   PROTIME-INR     Status: Normal   Collection Time   10/11/11  7:56 PM      Component Value Range Comment   Prothrombin Time 13.3  11.6 - 15.2 (seconds)    INR 0.99  0.00 - 1.49    MRSA PCR SCREENING     Status: Normal   Collection Time   10/11/11 10:42 PM      Component Value Range Comment   MRSA by PCR NEGATIVE   NEGATIVE    GLUCOSE, CAPILLARY     Status: Normal   Collection Time   10/11/11 10:43 PM      Component Value Range Comment   Glucose-Capillary 90  70 - 99 (mg/dL)   CBC     Status: Abnormal   Collection Time   10/11/11 11:36 PM      Component Value Range Comment   WBC 7.8  4.0 - 10.5 (K/uL)    RBC 3.68 (*) 3.87 - 5.11 (MIL/uL)    Hemoglobin 10.5 (*) 12.0 - 15.0 (g/dL)    HCT 32.6 (*) 36.0 - 46.0 (%)    MCV 88.6  78.0 - 100.0 (fL)    MCH 28.5  26.0 - 34.0 (pg)    MCHC 32.2  30.0 - 36.0 (g/dL)    RDW 14.6  11.5 - 15.5 (%)    Platelets 255  150 - 400 (K/uL)   GLUCOSE, CAPILLARY     Status: Normal   Collection Time   10/12/11  4:28 AM      Component Value Range Comment   Glucose-Capillary 87  70 - 99 (mg/dL)   MAGNESIUM     Status: Normal   Collection Time   10/12/11  4:43 AM      Component Value Range Comment   Magnesium 1.8  1.5 - 2.5 (mg/dL)   PHOSPHORUS     Status: Normal   Collection Time   10/12/11  4:43 AM      Component Value Range Comment   Phosphorus 2.8  2.3 - 4.6 (mg/dL)   COMPREHENSIVE METABOLIC PANEL     Status: Abnormal   Collection Time   10/12/11  4:43 AM      Component Value Range Comment   Sodium 141  135 - 145 (mEq/L)    Potassium 4.1  3.5 - 5.1 (mEq/L)    Chloride 109  96 - 112 (mEq/L)    CO2 22  19 - 32 (mEq/L)    Glucose, Bld 86  70 - 99 (mg/dL)    BUN 9  6 - 23 (mg/dL)    Creatinine, Ser 0.91  0.50 - 1.10 (mg/dL)    Calcium 10.1  8.4 - 10.5 (mg/dL)    Total Protein 6.8  6.0 - 8.3 (g/dL)    Albumin 3.4 (*) 3.5 - 5.2 (g/dL)    AST 17  0 - 37 (U/L)    ALT 10  0 - 35 (U/L)    Alkaline Phosphatase 73  39 - 117 (U/L)    Total Bilirubin 0.4  0.3 - 1.2 (mg/dL)    GFR calc non Af Amer 60 (*) >90 (mL/min)    GFR calc Af Amer 70 (*) >90 (mL/min)   CBC     Status: Abnormal   Collection Time   10/12/11  4:43 AM      Component Value Range Comment   WBC 7.8  4.0 - 10.5 (K/uL)    RBC 3.57 (*) 3.87 - 5.11 (MIL/uL)    Hemoglobin 10.2 (*) 12.0 - 15.0 (g/dL)     HCT 32.0 (*) 36.0 - 46.0 (%)    MCV 89.6  78.0 - 100.0 (fL)    MCH 28.6  26.0 - 34.0 (pg)    MCHC 31.9  30.0 - 36.0 (g/dL)    RDW 14.7  11.5 - 15.5 (%)    Platelets 259  150 - 400 (K/uL)   IRON AND TIBC     Status: Normal   Collection Time   10/12/11  4:43 AM      Component Value Range Comment   Iron 67  42 - 135 (ug/dL)    TIBC 260  250 - 470 (ug/dL)    Saturation Ratios 26  20 - 55 (%)    UIBC 193  125 - 400 (ug/dL)   RETICULOCYTES     Status: Abnormal   Collection Time   10/12/11  4:43 AM      Component Value Range Comment   Retic Ct Pct 1.5  0.4 - 3.1 (%)    RBC. 3.57 (*) 3.87 - 5.11 (MIL/uL)    Retic Count, Manual 53.6  19.0 - 186.0 (K/uL)   GLUCOSE, CAPILLARY     Status: Normal   Collection Time   10/12/11  8:04 AM      Component Value Range Comment   Glucose-Capillary 81  70 - 99 (mg/dL)    No results found.  Review of Systems  Constitutional: Negative for fever, chills, weight loss, malaise/fatigue and diaphoresis.  Eyes: Negative.   Respiratory: Negative.   Cardiovascular: Negative.   Gastrointestinal: Positive for constipation and blood in stool. Negative for heartburn, nausea, vomiting, abdominal pain, diarrhea and melena.  Genitourinary: Negative.   Musculoskeletal: Negative.   Skin: Negative.   Neurological: Negative.   Endo/Heme/Allergies: Negative.   Psychiatric/Behavioral: Negative.    Blood pressure 138/60, pulse 74, temperature 97.6 F (36.4 C), temperature source Oral, resp. rate 20, height _0  (1.702 m), weight 101.4 kg (223 lb 8.7 oz), SpO2 95.00%. Physical Exam  Constitutional: She is oriented to person, place, and time. She appears well-developed and well-nourished.  HENT:  Head: Normocephalic and atraumatic.  Eyes: Conjunctivae and EOM are normal. Pupils are equal, round, and reactive to light.  Neck: Normal range of motion. Neck supple.  Cardiovascular: Normal rate, regular rhythm and normal heart sounds.   Respiratory: Effort normal and  breath sounds normal.  GI: Soft. Bowel sounds are normal. She exhibits no distension and no mass. There is no tenderness. There is no rebound and no guarding.  Musculoskeletal: Normal range of motion.  Neurological: She is alert and oriented to person, place, and time. She has normal reflexes.  Skin: Skin is warm and dry.  Psychiatric: She has a normal mood and affect. Her behavior is normal. Judgment and thought content normal.    Assessment/Plan: 1) Rectal bleeding/Anemia: Almost 2.5 gms drop in hemoglobin-probably diverticular bleed. Will prep today for a colonoscopy tomorrow. 2) Diabetes mellitus: on insulin. 3) HTN/Hyperlipidemia.  4) Hypothyroidism: On Levothyroxine.  Alexa Lawson 10/12/2011, 11:59 AM

## 2011-10-13 ENCOUNTER — Encounter (HOSPITAL_COMMUNITY): Payer: Self-pay | Admitting: Gastroenterology

## 2011-10-13 ENCOUNTER — Encounter (HOSPITAL_COMMUNITY)
Admission: EM | Disposition: A | Discharge status: Home / Self Care | Payer: Self-pay | Source: Home / Self Care | Attending: Internal Medicine

## 2011-10-13 DIAGNOSIS — E119 Type 2 diabetes mellitus without complications: Secondary | ICD-10-CM

## 2011-10-13 DIAGNOSIS — I10 Essential (primary) hypertension: Secondary | ICD-10-CM

## 2011-10-13 DIAGNOSIS — D649 Anemia, unspecified: Secondary | ICD-10-CM

## 2011-10-13 DIAGNOSIS — K922 Gastrointestinal hemorrhage, unspecified: Secondary | ICD-10-CM

## 2011-10-13 HISTORY — PX: COLONOSCOPY: SHX5424

## 2011-10-13 LAB — CBC
MCHC: 31.7 g/dL (ref 30.0–36.0)
MCV: 88.5 fL (ref 78.0–100.0)
Platelets: 216 10*3/uL (ref 150–400)
RDW: 14.6 % (ref 11.5–15.5)
WBC: 8.2 10*3/uL (ref 4.0–10.5)

## 2011-10-13 LAB — GLUCOSE, CAPILLARY
Glucose-Capillary: 104 mg/dL — ABNORMAL HIGH (ref 70–99)
Glucose-Capillary: 86 mg/dL (ref 70–99)
Glucose-Capillary: 87 mg/dL (ref 70–99)

## 2011-10-13 LAB — BASIC METABOLIC PANEL
BUN: 6 mg/dL (ref 6–23)
Chloride: 111 mEq/L (ref 96–112)
GFR calc Af Amer: 69 mL/min — ABNORMAL LOW (ref >90)
GFR calc non Af Amer: 59 mL/min — ABNORMAL LOW (ref >90)
Potassium: 4.1 mEq/L (ref 3.5–5.1)
Sodium: 139 mEq/L (ref 135–145)

## 2011-10-13 SURGERY — COLONOSCOPY
Anesthesia: Moderate Sedation

## 2011-10-13 MED ORDER — AMLODIPINE BESYLATE 5 MG PO TABS
5.0000 mg | ORAL_TABLET | Freq: Every day | ORAL | Status: DC
  Administered 2011-10-13 – 2011-10-14 (×2): 5 mg via ORAL
  Filled 2011-10-13 (×2): qty 1

## 2011-10-13 MED ORDER — MIDAZOLAM HCL 10 MG/2ML IJ SOLN
INTRAMUSCULAR | Status: AC
  Filled 2011-10-13: qty 4

## 2011-10-13 MED ORDER — DIPHENHYDRAMINE HCL 50 MG/ML IJ SOLN
INTRAMUSCULAR | Status: AC
  Filled 2011-10-13: qty 1

## 2011-10-13 MED ORDER — INSULIN ASPART 100 UNIT/ML ~~LOC~~ SOLN: 0.0000 [IU] | Freq: Three times a day (TID) | SUBCUTANEOUS | Status: DC

## 2011-10-13 MED ORDER — AMLODIPINE BESYLATE-VALSARTAN 5-320 MG PO TABS: 1.0000 | ORAL_TABLET | Freq: Every day | ORAL | Status: DC

## 2011-10-13 MED ORDER — IRBESARTAN 300 MG PO TABS
300.0000 mg | ORAL_TABLET | Freq: Every day | ORAL | Status: DC
  Administered 2011-10-13 – 2011-10-14 (×2): 300 mg via ORAL
  Filled 2011-10-13 (×2): qty 1

## 2011-10-13 MED ORDER — MIDAZOLAM HCL 5 MG/5ML IJ SOLN
INTRAMUSCULAR | Status: DC | PRN
  Administered 2011-10-13 (×2): 2 mg via INTRAVENOUS
  Administered 2011-10-13: 1 mg via INTRAVENOUS
  Administered 2011-10-13 (×2): 2.5 mg via INTRAVENOUS

## 2011-10-13 MED ORDER — FENTANYL CITRATE 0.05 MG/ML IJ SOLN
INTRAMUSCULAR | Status: AC
  Filled 2011-10-13: qty 4

## 2011-10-13 MED ORDER — FENTANYL NICU IV SYRINGE 50 MCG/ML
INJECTION | INTRAMUSCULAR | Status: DC | PRN
  Administered 2011-10-13 (×4): 25 ug via INTRAVENOUS

## 2011-10-13 NOTE — Progress Notes (Signed)
[facility]  Consultants Dr Collene Mares- GI  Subjective: Small amount of bleeding with the colon prep but none today. S/p Colonoscopy. I have explained the findings of diverticulosis, diverticular bleed and dietary changes. Her daughter is at bedside. All questions have been answered.   Objective: Blood pressure 135/56, pulse 83, temperature 98.2 F (36.8 C), temperature source Oral, resp. rate 20, height _0  (1.702 m), weight 101.4 kg (223 lb 8.7 oz), SpO2 98.00%. Weight change:   Intake/Output Summary (Last 24 hours) at 10/13/11 1347 Last data filed at 10/13/11 1200  Gross per 24 hour  Intake   3685 ml  Output    933 ml  Net   2752 ml    Physical Exam: General appearance: alert, cooperative and no distress Throat: lips, mucosa, and tongue normal; teeth and gums normal Lungs: clear to auscultation bilaterally Heart: regular rate and rhythm, S1, S2 normal, no murmur, click, rub or gallop Abdomen: soft, non-tender; bowel sounds normal; no masses,  no organomegaly Extremities: extremities normal, atraumatic, no cyanosis or edema  Lab Results:  [facility] 10/13/11 0332 10/12/11 0443  NA 139 141  K 4.1 4.1  CL 111 109  CO2 20 22  GLUCOSE 93 86  BUN 6 9  CREATININE 0.92 0.91  CALCIUM 9.9 10.1  MG -- 1.8  PHOS -- 2.8    Basename 10/12/11 0443 10/11/11 1956  AST 17 18  ALT 10 11  ALKPHOS 73 78  BILITOT 0.4 0.2*  PROT 6.8 7.6  ALBUMIN 3.4* 3.8   No results found for this basename: LIPASE:2,AMYLASE:2 in the last 72 hours  Basename 10/12/11 1757 10/12/11 1227 10/11/11 1846  WBC 7.6 6.7 --  NEUTROABS -- -- 3.6  HGB 10.2* 10.1* --  HCT 31.7* 31.5* --  MCV 89.3 89.5 --  PLT 259 252 --   No results found for this basename: CKTOTAL:3,CKMB:3,CKMBINDEX:3,TROPONINI:3 in the last 72 hours No components found with this basename: POCBNP:3 No results found for this basename: DDIMER:2 in the last 72 hours  Basename 10/11/11 2336  HGBA1C 6.3*   No results found for this  basename: CHOL:2,HDL:2,LDLCALC:2,TRIG:2,CHOLHDL:2,LDLDIRECT:2 in the last 72 hours  Basename 10/12/11 0443  TSH 1.013  T4TOTAL --  T3FREE --  THYROIDAB --    Basename 10/12/11 0443  VITAMINB12 603  FOLATE >20.0  FERRITIN 89  TIBC 260  IRON 67  RETICCTPCT 1.5    Micro Results: Recent Results (from the past 240 hour(s))  MRSA PCR SCREENING     Status: Normal   Collection Time   10/11/11 10:42 PM      Component Value Range Status Comment   MRSA by PCR NEGATIVE  NEGATIVE  Final     Studies/Results: No results found.  Medications: Scheduled Meds:    . atorvastatin  40 mg Oral q1800  . carvedilol  12.5 mg Oral BID WC  . insulin aspart  0-9 Units Subcutaneous Q4H  . levothyroxine  75 mcg Oral Daily  . polyethylene glycol-electrolytes  4,000 mL Oral Once  . DISCONTD: pantoprazole (PROTONIX) IV  40 mg Intravenous QHS  . DISCONTD: sodium chloride  3 mL Intravenous Q12H   Continuous Infusions:    . DISCONTD: sodium chloride 125 mL/hr at 10/13/11 1142   PRN Meds:.acetaminophen, acetaminophen, albuterol, guaiFENesin-dextromethorphan, HYDROcodone-acetaminophen, ondansetron (ZOFRAN) IV, ondansetron, DISCONTD: fentaNYL, DISCONTD: midazolam  Assessment/Plan:  GI bleed Diverticula noted on colonoscopy. No blood noted. Diet resumed- liquids, advance as tolerated. Likely can go home tomorrow. D/c IVF.  Takes full dose ASA for 10  yrs for no specific reason. This has been held. Have advised to avoid ASA and NSAIDS for 2 wks and then cut back to 81 mg ASA. No h/o CVA, MI or PVD.    Diabetes mellitus Currently controlled- usually controlled at home as well.    HTN (hypertension) Resume Exforge today   Anemia Hgb dropped from 13.3 on admission to 10- repeat CBC today.   Code Status: Full code Disposition: cont to watch in SDU- likely d/c tomorrow.   [facility] [phone] 10/13/2011, 1:47 PM  LOS: 2 days

## 2011-10-13 NOTE — Op Note (Signed)
[facility] [place], [place]  [id number]  OPERATIVE PROCEDURE REPORT  PATIENT:  Alexa Lawson, Alexa Lawson  MR#:  [id number] BIRTHDATE:  10-05-1933  GENDER:  female ENDOSCOPIST:  Dr. Edmonia James, MD ASSISTANT:  Luanne Bras, RN CGRN & Blase Mess, technician.  PROCEDURE DATE:  10/13/2011 PRE-PROCEDURE PREPERATION:  The patient was prepped with a gallon of Nulytley the night prior to the procedure. The patient was fasted for 4 hours prior to the procedure. PRE-PROCEDURE PHYSICAL:  Patient has stable vital signs. Neck is supple. There is no JVD, thyromegaly or LAD. Chest clear to auscultation. S1 and S2 regular. Abdomen soft, morbidly obese, pendulous, non-distended, non-tender with NABS. PROCEDURE:  Colonoscopy with hot snare polypectomy x 1. ASA CLASS:  Class II INDICATIONS:  1) Rectal bleeding 2) Iron deficiency anemia 3) Personal history of colonic polyps 4) CRC screening. MEDICATIONS:  Fentanyl 100 mcg & Versed 10 mg IV.  DESCRIPTION OF PROCEDURE: After the risks, benefits, and alternatives of the procedure were thoroughly explained [including a 10% missed rate of cancer and polyps], informed consent was obtained. Digital rectal exam was performed. The [id number] ([id number]) was introduced through the anus and advanced to the cecum, which was identified by both the appendix and ileocecal valve, limited by a redundant colon.  The quality of the prep was fair, at best. Multiple washes were done. Small lesions could be missed. The instrument was then slowly withdrawn as the colon was fully examined. <<PROCEDUREIMAGES>>  FINDINGS:  There was evidence of extensive pandiverticulosis with inspissated stool in several of the diverticula. A large pedunculated polyp was remved by a hot snare x 1 [200/20] from the distal right colon. No masses or AVM's were noted. The appendiceal orifice and the ICV were identified and photographed. Retroflexed views  revealed small internal hemorrhoids. The patient tolerated the procedure without immediate complications.  The scope was then withdrawn from the patient and the procedure terminated.  IMPRESSION:  1) Small internal hemorrhoids. 2) Extensive pandiverticulosis. 3) One large polyp removed by hot snare x 1 [see description above]. 4) Large amount of stool in the colon-small lesions coul be missed.  RECOMMENDATIONS:  1) Hold all NSAIDS for 2 weeks. 2) Await pathology results. 3) Continue surveillance. 4) High fiber diet with liberal fluid intake. 5) Out patient follow-up in 2 weeks.  REPEAT EXAM:   In 3 years with  5 day Gol[employer] rep; in case the patient has any abnormal GI symptoms in the interim, she should contact the office immediately for further recommendations.  DISCHARGE INSTRUCTIONS:  Standard discharge instructions given.  ______________________________ Dr. Edmonia James, MD  CPT CODES: [phone]  DIAGNOSIS CODES:  569.3, 280.9, V12.72, V76.51  CC:  Sandy Salaam. Deatra Ina, M.D.  n. eSIGNED:   Dr. Edmonia James at 10/13/2011 10:08 AM  Cheri Guppy, [phone]

## 2011-10-14 DIAGNOSIS — E119 Type 2 diabetes mellitus without complications: Secondary | ICD-10-CM

## 2011-10-14 DIAGNOSIS — D649 Anemia, unspecified: Secondary | ICD-10-CM

## 2011-10-14 DIAGNOSIS — I10 Essential (primary) hypertension: Secondary | ICD-10-CM

## 2011-10-14 DIAGNOSIS — K922 Gastrointestinal hemorrhage, unspecified: Secondary | ICD-10-CM

## 2011-10-14 LAB — CBC
Hemoglobin: 9.4 g/dL — ABNORMAL LOW (ref 12.0–15.0)
MCHC: 31.3 g/dL (ref 30.0–36.0)
Platelets: 242 10*3/uL (ref 150–400)
RDW: 14.8 % (ref 11.5–15.5)

## 2011-10-14 LAB — GLUCOSE, CAPILLARY: Glucose-Capillary: 111 mg/dL — ABNORMAL HIGH (ref 70–99)

## 2011-10-14 MED ORDER — DIPHENHYDRAMINE HCL 25 MG PO CAPS
25.0000 mg | ORAL_CAPSULE | ORAL | Status: AC
  Administered 2011-10-14: 25 mg via ORAL

## 2011-10-14 NOTE — Progress Notes (Signed)
Pt to be d/c home. VSS. Reviewed d/c instructions, f/up appts, etc.

## 2011-10-14 NOTE — Discharge Instructions (Signed)
Anemia, Frequently Asked Questions WHAT ARE THE SYMPTOMS OF ANEMIA?  Headache.   Difficulty thinking.   Fatigue.   Shortness of breath.   Weakness.   Rapid heartbeat.  AT WHAT POINT ARE PEOPLE CONSIDERED ANEMIC?  This varies with gender and age.   Both hemoglobin (Hgb) and hematocrit values are used to define anemia. These lab values are obtained from a complete blood count (CBC) test. This is performed at a caregiver's office.   The normal range of hemoglobin values for adult men is 14.0 g/dL to 17.4 g/dL. For nonpregnant women, values are 12.3 g/dL to 15.3 g/dL.   The World Health Organization defines anemia as less than 12 g/dL for nonpregnant women and less than 13 g/dL for men.   For adult males, the average normal hematocrit is 46%, and the range is 40% to 52%.   For adult females, the average normal hematocrit is 41%, and the range is 35% to 47%.   Values that fall below the lower limits can be a sign of anemia and should have further checking (evaluation).  GROUPS OF PEOPLE WHO ARE AT RISK FOR DEVELOPING ANEMIA INCLUDE:   Infants who are breastfed or taking a formula that is not fortified with iron.   Children going through a rapid growth spurt. The iron available can not keep up with the needs for a red cell mass which must grow with the child.   Women in childbearing years. They need iron because of blood loss during menstruation.   Pregnant women. The growing fetus creates a high demand for iron.   People with ongoing gastrointestinal blood loss are at risk of developing iron deficiency.   Individuals with leukemia or cancer who must receive chemotherapy or radiation to treat their disease. The drugs or radiation used to treat these diseases often decreases the bone marrow's ability to make cells of all classes. This includes red blood cells, white blood cells, and platelets.   Individuals with chronic inflammatory conditions such as rheumatoid arthritis or  chronic infections.   The elderly.  ARE SOME TYPES OF ANEMIA INHERITED?   Yes, some types of anemia are due to inherited or genetic defects.   Sickle cell anemia. This occurs most often in people of African, African American, and Mediterranean descent.   Thalassemia (or Cooley's anemia). This type is found in people of [place] and [place] descent. These types of anemia are common.   [place]. This is rare.  CAN CERTAIN MEDICATIONS CAUSE A PERSON TO BECOME ANEMIC?  Yes. For example, drugs to fight cancer (chemotherapeutic agents) often cause anemia. These drugs can slow the bone marrow's ability to make red blood cells. If there are not enough red blood cells, the body does not get enough oxygen. WHAT HEMATOCRIT LEVEL IS REQUIRED TO DONATE BLOOD?  The lower limit of an acceptable hematocrit for blood donors is 38%. If you have a low hematocrit value, you should schedule an appointment with your caregiver. ARE BLOOD TRANSFUSIONS COMMONLY USED TO CORRECT ANEMIA, AND ARE THEY DANGEROUS?  They are used to treat anemia as a last resort. Your caregiver will find the cause of the anemia and correct it if possible. Most blood transfusions are given because of excessive bleeding at the time of surgery, with trauma, or because of bone marrow suppression in patients with cancer or leukemia on chemotherapy. Blood transfusions are safer than ever before. We also know that blood transfusions affect the immune system and may increase certain risks. There is  also a concern for human error. In 1/16,000 transfusions, a patient receives a transfusion of blood that is not matched with his or her blood type.  WHAT IS IRON DEFICIENCY ANEMIA AND CAN I CORRECT IT BY CHANGING MY DIET?  Iron is an essential part of hemoglobin. Without enough hemoglobin, anemia develops and the body does not get the right amount of oxygen. Iron deficiency anemia develops after the body has had a low level of iron for a long  time. This is either caused by blood loss, not taking in or absorbing enough iron, or increased demands for iron (like pregnancy or rapid growth).  Foods from animal origin such as beef, chicken, and pork, are good sources of iron. Be sure to have one of these foods at each meal. Vitamin C helps your body absorb iron. Foods rich in Vitamin C include citrus, bell pepper, strawberries, spinach and cantaloupe. In some cases, iron supplements may be needed in order to correct the iron deficiency. In the case of poor absorption, extra iron may have to be given directly into the vein through a needle (intravenously). I HAVE BEEN DIAGNOSED WITH IRON DEFICIENCY ANEMIA AND MY CAREGIVER PRESCRIBED IRON SUPPLEMENTS. HOW LONG WILL IT TAKE FOR MY BLOOD TO BECOME NORMAL?  It depends on the degree of anemia at the beginning of treatment. Most people with mild to moderate iron deficiency, anemia will correct the anemia over a period of 2 to 3 months. But after the anemia is corrected, the iron stored by the body is still low. Caregivers often suggest an additional 6 months of oral iron therapy once the anemia has been reversed. This will help prevent the iron deficiency anemia from quickly happening again. Non-anemic adult males should take iron supplements only under the direction of a doctor, too much iron can cause liver damage.  MY HEMOGLOBIN IS 9 G/DL AND I AM SCHEDULED FOR SURGERY. SHOULD I POSTPONE THE SURGERY?  If you have Hgb of 9, you should discuss this with your caregiver right away. Many patients with similar hemoglobin levels have had surgery without problems. If minimal blood loss is expected for a minor procedure, no treatment may be necessary.  If a greater blood loss is expected for more extensive procedures, you should ask your caregiver about being treated with erythropoietin and iron. This is to accelerate the recovery of your hemoglobin to a normal level before surgery. An anemic patient who undergoes  high-blood-loss surgery has a greater risk of surgical complications and need for a blood transfusion, which also carries some risk.  I HAVE BEEN TOLD THAT HEAVY MENSTRUAL PERIODS CAUSE ANEMIA. IS THERE ANYTHING I CAN DO TO PREVENT THE ANEMIA?  Anemia that results from heavy periods is usually due to iron deficiency. You can try to meet the increased demands for iron caused by the heavy monthly blood loss by increasing the intake of iron-rich foods. Iron supplements may be required. Discuss your concerns with your caregiver. WHAT CAUSES ANEMIA DURING PREGNANCY?  Pregnancy places major demands on the body. The mother must meet the needs of both her body and her growing baby. The body needs enough iron and folate to make the right amount of red blood cells. To prevent anemia while pregnant, the mother should stay in close contact with her caregiver.  Be sure to eat a diet that has foods rich in iron and folate like liver and dark green leafy vegetables. Folate plays an important role in the normal development of a baby's  spinal cord. Folate can help prevent serious disorders like spina bifida. If your diet does not provide adequate nutrients, you may want to talk with your caregiver about nutritional supplements.  WHAT IS THE RELATIONSHIP BETWEEN FIBROID TUMORS AND ANEMIA IN WOMEN?  The relationship is usually caused by the increased menstrual blood loss caused by fibroids. Good iron intake may be required to prevent iron deficiency anemia from developing.  Document Released: 12/13/2003 Document Revised: 04/25/2011 Document Reviewed: 05/29/2010 [facility] Patient Information 2012 [place].  High Fiber Diet A high fiber diet changes your normal diet to include more whole grains, legumes, fruits, and vegetables. Changes in the diet involve replacing refined carbohydrates with unrefined foods. The calorie level of the diet is essentially unchanged. The Dietary Reference Intake (recommended amount) for  adult males is 38 g per day. For adult females, it is 25 g per day. Pregnant and lactating women should consume 28 g of fiber per day. Fiber is the intact part of a plant that is not broken down during digestion. Functional fiber is fiber that has been isolated from the plant to provide a beneficial effect in the body. PURPOSE  Increase stool bulk.   Ease and regulate bowel movements.   Lower cholesterol.  INDICATIONS THAT YOU NEED MORE FIBER  Constipation and hemorrhoids.   Uncomplicated diverticulosis (intestine condition) and irritable bowel syndrome.   Weight management.   As a protective measure against hardening of the arteries (atherosclerosis), diabetes, and cancer.  NOTE OF CAUTION If you have a digestive or bowel problem, ask your caregiver for advice before adding high fiber foods to your diet. Some of the following medical problems are such that a high fiber diet should not be used without consulting your caregiver:  Acute diverticulitis (intestine infection).   Partial small bowel obstructions.   Complicated diverticular disease involving bleeding, rupture (perforation), or abscess (boil, furuncle).   Presence of autonomic neuropathy (nerve damage) or gastric paresis (stomach cannot empty itself).  GUIDELINES FOR INCREASING FIBER  Start adding fiber to the diet slowly. A gradual increase of about 5 more grams (2 slices of whole-wheat bread, 2 servings of most fruits or vegetables, or 1 bowl of high fiber cereal) per day is best. Too rapid an increase in fiber may result in constipation, flatulence, and bloating.   Drink enough water and fluids to keep your urine clear or pale yellow. Water, juice, or caffeine-free drinks are recommended. Not drinking enough fluid may cause constipation.   Eat a variety of high fiber foods rather than one type of fiber.   Try to increase your intake of fiber through using high fiber foods rather than fiber pills or supplements that  contain small amounts of fiber.   The goal is to change the types of food eaten. Do not supplement your present diet with high fiber foods, but replace foods in your present diet.  INCLUDE A VARIETY OF FIBER SOURCES  Replace refined and processed grains with whole grains, canned fruits with fresh fruits, and incorporate other fiber sources. White rice, white breads, and most bakery goods contain little or no fiber.   Brown whole-grain rice, buckwheat oats, and many fruits and vegetables are all good sources of fiber. These include: broccoli, Brussels sprouts, cabbage, cauliflower, beets, sweet potatoes, white potatoes (skin on), carrots, tomatoes, eggplant, squash, berries, fresh fruits, and dried fruits.   Cereals appear to be the richest source of fiber. Cereal fiber is found in whole grains and bran. Bran is the fiber-rich  outer coat of cereal grain, which is largely removed in refining. In whole-grain cereals, the bran remains. In breakfast cereals, the largest amount of fiber is found in those with "bran" in their names. The fiber content is sometimes indicated on the label.   You may need to include additional fruits and vegetables each day.   In baking, for 1 cup white flour, you may use the following substitutions:   1 cup whole-wheat flour minus 2 tbs.    cup white flour plus  cup whole-wheat flour.  Document Released: 05/06/2005 Document Revised: 04/25/2011 Document Reviewed: 03/14/2009 [facility] Patient Information 2012 [place], [place].  Diverticulosis Diverticulosis is a common condition that develops when small pouches (diverticula) form in the wall of the colon. The risk of diverticulosis increases with age. It happens more often in people who eat a low-fiber diet. Most individuals with diverticulosis have no symptoms. Those individuals with symptoms usually experience abdominal pain, constipation, or loose stools (diarrhea). HOME CARE INSTRUCTIONS   Increase the amount of  fiber in your diet as directed by your caregiver or dietician. This may reduce symptoms of diverticulosis.   Your caregiver may recommend taking a dietary fiber supplement.   Drink at least 6 to 8 glasses of water each day to prevent constipation.   Try not to strain when you have a bowel movement.   Your caregiver may recommend avoiding nuts and seeds to prevent complications, although this is still an uncertain benefit.   Only take over-the-counter or prescription medicines for pain, discomfort, or fever as directed by your caregiver.  FOODS WITH HIGH FIBER CONTENT INCLUDE:  Fruits. Apple, peach, pear, tangerine, raisins, prunes.   Vegetables. Brussels sprouts, asparagus, broccoli, cabbage, carrot, cauliflower, romaine lettuce, spinach, summer squash, tomato, winter squash, zucchini.   Starchy Vegetables. Baked beans, kidney beans, lima beans, split peas, lentils, potatoes (with skin).   Grains. Whole wheat bread, brown rice, bran flake cereal, plain oatmeal, white rice, shredded wheat, bran muffins.  SEEK IMMEDIATE MEDICAL CARE IF:   You develop increasing pain or severe bloating.   You have an oral temperature above 102 F (38.9 C), not controlled by medicine.   You develop vomiting or bowel movements that are bloody or black.  Document Released: 02/01/2004 Document Revised: 04/25/2011 Document Reviewed: 10/04/2009 [facility] Patient Information 2012 [place].

## 2011-10-14 NOTE — Discharge Summary (Signed)
DISCHARGE SUMMARY  Alexa Lawson  MR#: [id number]  DOB:1933/09/13  Date of Admission: 10/11/2011 Date of Discharge: 10/14/2011  Attending Physician:Journee Bobrowski T  Patient's YIR:SWNIO,EVOJJKK S, MD, MD  Consults: Dr. Collene Mares - GI  Disposition: D/C Home  Follow-up Appts: Discharge Orders    Future Orders Please Complete By Expires   Increase activity slowly      Discharge instructions      Comments:   Call your doctor or return to the ER if you develop recurrent bleeding.  Make sure to add fiber to your diet daily as we discussed.        Tests Needing Follow-up: One large polyp removed by hot snare x 1 - path pending at time of d/c  CBC is recommended at time of f/u with Primary MD   Discharge Diagnoses: Present on Admission:  .GI bleed - diverticular  .Diabetes mellitus .HTN (hypertension) .Anemia - due to acute blood loss (GIB)  Initial presentation: 78 y.o. Female with a past medical history of Hypertension; Diabetes mellitus; Diverticulosis; and Anemia who presented to [facility] 10/11/2011 with a cc of blood in her stool. She went to Urgent Care and was found to be anemic and tachycardic and sent to ER. While at urgent care the hemoglobin went from 13.3 to 10.7.   Hospital Course:  GI bleed  Diverticula noted on colonoscopy - No active bleeding at time of colonoscopy - diet resumed - liquids, advanced as tolerated to normal w/o trouble - have advised to avoid ASA and all NSAIDS for 2 wks and then cut back to 81 mg ASA - no h/o CVA, MI or PVD   Diabetes mellitus  Currently controlled - usually controlled at home as well  HTN (hypertension)  Resume Exforge  Anemia due to acute blood loss Hgb dropped from 13.3 on admission to 10 - Hgb stable at time of d/c - needs f/u CBC in 7-10 days   Colon polyp Removed by snare per GI - path results not yet available at time of d/c - need to f/u as outpt   Medication List  As of 10/14/2011  3:26 PM   STOP taking these  medications         aspirin 325 MG EC tablet         TAKE these medications         amLODipine-valsartan 5-320 MG per tablet   Commonly known as: EXFORGE   Take 1 tablet by mouth daily.      carvedilol 12.5 MG tablet   Commonly known as: COREG   Take 12.5 mg by mouth 2 (two) times daily with a meal.      levothyroxine 75 MCG tablet   Commonly known as: SYNTHROID, LEVOTHROID   Take 75 mcg by mouth daily.      metFORMIN 1000 MG tablet   Commonly known as: GLUCOPHAGE   Take 1,000 mg by mouth daily with breakfast.      rosuvastatin 20 MG tablet   Commonly known as: CRESTOR   Take 20 mg by mouth daily.      Vitamin D (Ergocalciferol) 50000 UNITS Caps   Commonly known as: DRISDOL   Take 50,000 Units by mouth every 14 (fourteen) days.           Day of Discharge BP 120/55  Pulse 69  Temp(Src) 98.1 F (36.7 C) (Oral)  Resp 25  Ht _0  (1.702 m)  Wt 103 kg (227 lb 1.2 oz)  BMI 35.56 kg/m2  SpO2  96%  Physical Exam: General: No acute respiratory distress Lungs: Clear to auscultation bilaterally without wheezes or crackles Cardiovascular: Regular rate and rhythm without murmur gallop or rub normal S1 and S2 Abdomen: Nontender, nondistended, soft, bowel sounds positive, no rebound, no ascites, no appreciable mass Extremities: No significant cyanosis, clubbing, or edema bilateral lower extremities  Results for orders placed during the hospital encounter of 10/11/11 (from the past 24 hour(s))  GLUCOSE, CAPILLARY     Status: Abnormal   Collection Time   10/13/11  5:10 PM      Component Value Range   Glucose-Capillary 104 (*) 70 - 99 (mg/dL)   Comment 1 Notify RN    CBC     Status: Abnormal   Collection Time   10/14/11  3:50 AM      Component Value Range   WBC 7.4  4.0 - 10.5 (K/uL)   RBC 3.35 (*) 3.87 - 5.11 (MIL/uL)   Hemoglobin 9.4 (*) 12.0 - 15.0 (g/dL)   HCT 30.0 (*) 36.0 - 46.0 (%)   MCV 89.6  78.0 - 100.0 (fL)   MCH 28.1  26.0 - 34.0 (pg)   MCHC 31.3  30.0 -  36.0 (g/dL)   RDW 14.8  11.5 - 15.5 (%)   Platelets 242  150 - 400 (K/uL)  GLUCOSE, CAPILLARY     Status: Normal   Collection Time   10/14/11  8:08 AM      Component Value Range   Glucose-Capillary 90  70 - 99 (mg/dL)   Comment 1 Notify RN    GLUCOSE, CAPILLARY     Status: Abnormal   Collection Time   10/14/11 12:19 PM      Component Value Range   Glucose-Capillary 111 (*) 70 - 99 (mg/dL)   Comment 1 Notify RN      Follow-up Information    Call Foye Spurling, MD. (call to arrange a visit in 7-10 days -  you will need to have a blood level checked at that time to recheck your anemia.  )    Contact information:   [address] [place] [phone]         Time spent in discharge (includes decision making & examination of pt): >30 minutes  10/14/2011, 3:26 PM   Cherene Altes, MD [facility] Office  [phone] Pager [phone]  On-Call/Text Page:      [email or web address]      password [facility]

## 2011-10-15 ENCOUNTER — Encounter (HOSPITAL_COMMUNITY): Payer: Self-pay | Admitting: Gastroenterology

## 2011-10-16 ENCOUNTER — Encounter: Payer: Self-pay | Admitting: Gastroenterology

## 2011-10-16 NOTE — Progress Notes (Signed)
Utilization Review Completed.Donne Anon T5/29/2013

## 2012-05-06 ENCOUNTER — Other Ambulatory Visit: Payer: Self-pay | Admitting: Internal Medicine

## 2012-05-12 ENCOUNTER — Encounter (HOSPITAL_COMMUNITY)
Admission: RE | Admit: 2012-05-12 | Discharge: 2012-05-12 | Disposition: A | Discharge status: Home / Self Care | Payer: Medicare Other | Source: Ambulatory Visit | Attending: Internal Medicine | Admitting: Internal Medicine

## 2012-05-12 MED ORDER — TECHNETIUM TC 99M SESTAMIBI GENERIC - CARDIOLITE
25.0000 | Freq: Once | INTRAVENOUS | Status: AC | PRN
  Administered 2012-05-12: 25 via INTRAVENOUS

## 2012-07-14 ENCOUNTER — Encounter (INDEPENDENT_AMBULATORY_CARE_PROVIDER_SITE_OTHER): Payer: Self-pay

## 2012-07-15 ENCOUNTER — Ambulatory Visit (INDEPENDENT_AMBULATORY_CARE_PROVIDER_SITE_OTHER): Payer: Medicare Other | Admitting: General Surgery

## 2012-07-15 ENCOUNTER — Other Ambulatory Visit (INDEPENDENT_AMBULATORY_CARE_PROVIDER_SITE_OTHER): Payer: Self-pay | Admitting: General Surgery

## 2012-07-15 ENCOUNTER — Encounter (INDEPENDENT_AMBULATORY_CARE_PROVIDER_SITE_OTHER): Payer: Self-pay | Admitting: General Surgery

## 2012-07-15 VITALS — BP 160/94 | HR 93 | Temp 97.5°F | Resp 18 | Ht 67.5 in | Wt 218.0 lb

## 2012-07-15 DIAGNOSIS — D351 Benign neoplasm of parathyroid gland: Secondary | ICD-10-CM | POA: Insufficient documentation

## 2012-07-15 NOTE — Progress Notes (Signed)
Patient ID: Alexa Lawson, female   DOB: 28-Jun-1933, 79 y.o.   MRN: [id number]  Chief Complaint  Patient presents with  . New Evaluation    eval hyperparathyroid    HPI Alexa Lawson is a 79 y.o. female.   HPI  She is referred by Dr. Jeanann Lewandowsky for evaluation of recurrent hyperparathyroidism. She had excision of bilateral parathyroid adenomas in 2005 in [place]. There was a right inferior parathyroid adenoma. The left inferior sampling was suggestive of a parathyroid adenoma. She was symptomatic prior to surgery and stating she was tired all the time and slept a lot. After the surgery she "woke up." Recently, she's becoming tired again like she was prior to the previous surgery. She's been noted to have an elevated calcium level of 10.9. A sestamibi scan has been done as well which demonstrates abnormal uptake in the left neck consistent with a parathyroid adenoma.  Past Medical History  Diagnosis Date  . Hypertension   . Diabetes mellitus   . Diverticulosis   . Anemia     Past Surgical History  Procedure Laterality Date  . Parathyroid sx  2005  . Colonoscopy  10/13/2011    Procedure: COLONOSCOPY;  Surgeon: Juanita Craver, MD;  [facility] END[facility]COPY;  Service: Endoscopy;  Laterality: N/A;  . Abdominal hysterectomy  07/1984    Family History  Problem Relation Age of Onset  . Cancer Mother   . Diabetes Maternal Uncle   . Diabetes Maternal Uncle     Social History History  Substance Use Topics  . Smoking status: Former Smoker    Quit date: 03/20/1997  . Smokeless tobacco: Never Used  . Alcohol Use: No    No Known Allergies  Current Outpatient Prescriptions  Medication Sig Dispense Refill  . amLODipine-valsartan (EXFORGE) 5-320 MG per tablet Take 1 tablet by mouth daily.      . carvedilol (COREG) 12.5 MG tablet Take 12.5 mg by mouth 2 (two) times daily with a meal.      . Iron-Vitamin C (VITRON-C) 65-125 MG TABS Take by mouth. 2 Tablets Daily.      Marland Kitchen  levothyroxine (SYNTHROID, LEVOTHROID) 75 MCG tablet Take 75 mcg by mouth daily.      . metFORMIN (GLUCOPHAGE) 1000 MG tablet Take 1,000 mg by mouth daily with breakfast.       . Multiple Vitamins-Minerals (MULTIVITAMIN WITH MINERALS) tablet Take 1 tablet by mouth daily.      . Vitamin D, Ergocalciferol, (DRISDOL) 50000 UNITS CAPS Take 50,000 Units by mouth every 14 (fourteen) days.        No current facility-administered medications for this visit.    Review of Systems Review of Systems  Constitutional: Positive for fatigue.  Respiratory: Positive for shortness of breath.   Cardiovascular: Negative.   Gastrointestinal:       Hemorrhoids.  Genitourinary: Negative.   Hematological: Negative.     Blood pressure 160/94, pulse 93, temperature 97.5 F (36.4 C), temperature source Temporal, resp. rate 18, height 5' 7.5" (1.715 m), weight 218 lb (98.884 kg).  Physical Exam Physical Exam  Constitutional: No distress.  Overweight female.  HENT:  Head: Normocephalic and atraumatic.  Eyes: EOM are normal. No scleral icterus.  Neck: Neck supple. No thyromegaly present.  Lower transverse scar.  Cardiovascular: Normal rate and regular rhythm.   Pulmonary/Chest: Effort normal and breath sounds normal.  Abdominal: Soft. There is no tenderness.  Musculoskeletal: She exhibits no edema.  Lymphadenopathy:    She has no  cervical adenopathy.  Skin: Skin is warm and dry.  Psychiatric: She has a normal mood and affect. Her behavior is normal.    Data Reviewed Notes from Dr. Ainsley Spinner office. Previous pathology report. Calcium level.  Assessment    Findings suspicious for recurrent hyperparathyroidism secondary to a parathyroid adenoma. By report, she's already had 2 parathyroid glands removed.     Plan    Recheck intake PTH level and vitamin D. We discussed recurrent neck exploration and parathyroidectomy.  I went over the procedure and risks with her in detail. Risks include but limited to  bleeding, infection, when he becomes anesthesia, permanent courses to the recurrent laryngeal nerve damage, injury to the esophagus, possible recurrence. She understands the plan. I will speak with her once again once I have these recent blood test.       Anelia Carriveau J 07/15/2012, 12:46 PM

## 2012-07-15 NOTE — Patient Instructions (Signed)
We will call you with the results of the blood test.

## 2012-07-23 ENCOUNTER — Encounter (INDEPENDENT_AMBULATORY_CARE_PROVIDER_SITE_OTHER): Payer: Self-pay | Admitting: General Surgery

## 2012-07-23 NOTE — Progress Notes (Signed)
Patient ID: Alexa Lawson, female   DOB: 15-Apr-1934, 79 y.o.   MRN: [id number] I spoke with her today about her parathyroid hormone level. It is significantly elevated. I recommended she consider a redo neck exploration and removal of the parathyroid gland that was seen on the nuclear medicine scan. She is not ready to make a decision on whether she wants to have surgery or not. I encouraged her to think about it, speak with her family, and call us back and let us know how she would like to proceed.

## 2012-07-30 ENCOUNTER — Encounter (INDEPENDENT_AMBULATORY_CARE_PROVIDER_SITE_OTHER): Payer: Self-pay | Admitting: General Surgery

## 2012-07-30 ENCOUNTER — Telehealth (INDEPENDENT_AMBULATORY_CARE_PROVIDER_SITE_OTHER): Payer: Self-pay | Admitting: General Surgery

## 2012-07-30 NOTE — Telephone Encounter (Signed)
Pt called and requested Dr. Zella Richer to call her daughter, Rikia Sukhu, who is helping her make the decision about surgery on her parathyroid.  Daughter can be reached at [phone].  Daughter has questions that pt cannot answer.

## 2012-07-30 NOTE — Progress Notes (Signed)
Patient ID: Alexa Lawson, female   DOB: [birth year], 80 y.o.   MRN: [id number] Her daughter, Lattie Haw, called and had some questions about the surgery. She states her mother wants to go ahead with the neck exploration and parathyroidectomy. I answered her daughter's questions. We will work on getting this scheduled.

## 2012-08-13 ENCOUNTER — Encounter (HOSPITAL_COMMUNITY): Payer: Self-pay | Admitting: Pharmacy Technician

## 2012-08-18 ENCOUNTER — Other Ambulatory Visit (HOSPITAL_COMMUNITY): Payer: Self-pay | Admitting: General Surgery

## 2012-08-18 NOTE — Patient Instructions (Addendum)
[place]  08/18/2012   Your procedure is scheduled on: 08-27-2012  Report to [place] at [place] AM.  Call this number if you have problems the morning of surgery [phone]   Remember:   Do not eat food or drink liquids :After Midnight.     Take these medicines the morning of surgery with A SIP OF WATER: coreg, synthroid                                SEE [facility] PREPARING FOR SURGERY SHEET   Do not wear jewelry, make-up or nail polish.  Do not wear lotions, powders, or perfumes. You may wear deodorant.   Men may shave face and neck.  Do not bring valuables to the hospital.  Contacts, dentures or bridgework may not be worn into surgery.  Leave suitcase in the car. After surgery it may be brought to your room.  For patients admitted to the hospital, checkout time is 11:00 AM the day of discharge.   Patients discharged the day of surgery will not be allowed to drive home.  Name and phone number of your driver:  Special Instructions: N/A   Please read over the following fact sheets that you were given: MRSA Information.  Call Zelphia Cairo RN pre op nurse if needed [phone]    FAILURE TO FOLLOW THESE INSTRUCTIONS MAY RESULT IN THE CANCELLATION OF YOUR SURGERY. PATIENT SIGNATURE___________________________________________

## 2012-08-19 ENCOUNTER — Encounter (HOSPITAL_COMMUNITY): Payer: Self-pay

## 2012-08-19 ENCOUNTER — Encounter (HOSPITAL_COMMUNITY)
Admission: RE | Admit: 2012-08-19 | Discharge: 2012-08-19 | Disposition: A | Discharge status: Home / Self Care | Payer: Medicare Other | Source: Ambulatory Visit | Attending: General Surgery | Admitting: General Surgery

## 2012-08-19 ENCOUNTER — Ambulatory Visit (HOSPITAL_COMMUNITY)
Admission: RE | Admit: 2012-08-19 | Discharge: 2012-08-19 | Disposition: A | Discharge status: Home / Self Care | Payer: Medicare Other | Source: Ambulatory Visit | Attending: General Surgery | Admitting: General Surgery

## 2012-08-19 DIAGNOSIS — R0602 Shortness of breath: Secondary | ICD-10-CM | POA: Insufficient documentation

## 2012-08-19 DIAGNOSIS — R059 Cough, unspecified: Secondary | ICD-10-CM | POA: Insufficient documentation

## 2012-08-19 DIAGNOSIS — Z01818 Encounter for other preprocedural examination: Secondary | ICD-10-CM | POA: Insufficient documentation

## 2012-08-19 DIAGNOSIS — R05 Cough: Secondary | ICD-10-CM | POA: Insufficient documentation

## 2012-08-19 DIAGNOSIS — R9431 Abnormal electrocardiogram [ECG] [EKG]: Secondary | ICD-10-CM | POA: Insufficient documentation

## 2012-08-19 DIAGNOSIS — Z01810 Encounter for preprocedural cardiovascular examination: Secondary | ICD-10-CM | POA: Insufficient documentation

## 2012-08-19 DIAGNOSIS — Z01812 Encounter for preprocedural laboratory examination: Secondary | ICD-10-CM | POA: Insufficient documentation

## 2012-08-19 DIAGNOSIS — I4510 Unspecified right bundle-branch block: Secondary | ICD-10-CM | POA: Insufficient documentation

## 2012-08-19 DIAGNOSIS — I10 Essential (primary) hypertension: Secondary | ICD-10-CM | POA: Insufficient documentation

## 2012-08-19 HISTORY — DX: Hypothyroidism, unspecified: E03.9

## 2012-08-19 LAB — CBC WITH DIFFERENTIAL/PLATELET
Basophils Absolute: 0 10*3/uL (ref 0.0–0.1)
Basophils Relative: 0 % (ref 0–1)
Eosinophils Absolute: 0.2 10*3/uL (ref 0.0–0.7)
Hemoglobin: 12 g/dL (ref 12.0–15.0)
MCH: 28.2 pg (ref 26.0–34.0)
MCHC: 31.7 g/dL (ref 30.0–36.0)
Monocytes Absolute: 0.5 10*3/uL (ref 0.1–1.0)
Monocytes Relative: 8 % (ref 3–12)
Neutrophils Relative %: 59 % (ref 43–77)
RDW: 15 % (ref 11.5–15.5)

## 2012-08-19 LAB — SURGICAL PCR SCREEN: Staphylococcus aureus: NEGATIVE

## 2012-08-19 LAB — PROTIME-INR
INR: 0.97 (ref 0.00–1.49)
Prothrombin Time: 12.8 seconds (ref 11.6–15.2)

## 2012-08-19 LAB — COMPREHENSIVE METABOLIC PANEL
AST: 15 U/L (ref 0–37)
Albumin: 3.4 g/dL — ABNORMAL LOW (ref 3.5–5.2)
BUN: 7 mg/dL (ref 6–23)
Creatinine, Ser: 0.99 mg/dL (ref 0.50–1.10)
Potassium: 4.4 mEq/L (ref 3.5–5.1)
Total Protein: 7.6 g/dL (ref 6.0–8.3)

## 2012-08-19 NOTE — Progress Notes (Signed)
CHEST XRAY RESULTS ROUTED TO DR Zella Richer IN BASKET BY EPIC

## 2012-08-27 ENCOUNTER — Inpatient Hospital Stay (HOSPITAL_COMMUNITY): Payer: Medicare Other | Admitting: Anesthesiology

## 2012-08-27 ENCOUNTER — Encounter (HOSPITAL_COMMUNITY): Payer: Self-pay | Admitting: Anesthesiology

## 2012-08-27 ENCOUNTER — Encounter (HOSPITAL_COMMUNITY): Payer: Self-pay | Admitting: Registered Nurse

## 2012-08-27 ENCOUNTER — Encounter (HOSPITAL_COMMUNITY)
Admission: RE | Disposition: A | Discharge status: Home / Self Care | Payer: Self-pay | Source: Ambulatory Visit | Attending: General Surgery

## 2012-08-27 ENCOUNTER — Ambulatory Visit (HOSPITAL_COMMUNITY)
Admission: RE | Admit: 2012-08-27 | Discharge: 2012-08-28 | Disposition: A | Discharge status: Home / Self Care | Payer: Medicare Other | Source: Ambulatory Visit | Attending: General Surgery | Admitting: General Surgery

## 2012-08-27 DIAGNOSIS — M542 Cervicalgia: Secondary | ICD-10-CM | POA: Insufficient documentation

## 2012-08-27 DIAGNOSIS — I10 Essential (primary) hypertension: Secondary | ICD-10-CM | POA: Insufficient documentation

## 2012-08-27 DIAGNOSIS — J8410 Pulmonary fibrosis, unspecified: Secondary | ICD-10-CM | POA: Insufficient documentation

## 2012-08-27 DIAGNOSIS — E119 Type 2 diabetes mellitus without complications: Secondary | ICD-10-CM | POA: Insufficient documentation

## 2012-08-27 DIAGNOSIS — D351 Benign neoplasm of parathyroid gland: Secondary | ICD-10-CM

## 2012-08-27 DIAGNOSIS — E213 Hyperparathyroidism, unspecified: Secondary | ICD-10-CM | POA: Insufficient documentation

## 2012-08-27 DIAGNOSIS — Z79899 Other long term (current) drug therapy: Secondary | ICD-10-CM | POA: Insufficient documentation

## 2012-08-27 DIAGNOSIS — E039 Hypothyroidism, unspecified: Secondary | ICD-10-CM | POA: Insufficient documentation

## 2012-08-27 DIAGNOSIS — E211 Secondary hyperparathyroidism, not elsewhere classified: Secondary | ICD-10-CM

## 2012-08-27 HISTORY — PX: PARATHYROIDECTOMY: SHX19

## 2012-08-27 LAB — GLUCOSE, CAPILLARY
Glucose-Capillary: 150 mg/dL — ABNORMAL HIGH (ref 70–99)
Glucose-Capillary: 124 mg/dL — ABNORMAL HIGH (ref 70–99)

## 2012-08-27 LAB — CALCIUM: Calcium: 9.5 mg/dL (ref 8.4–10.5)

## 2012-08-27 SURGERY — PARATHYROIDECTOMY
Anesthesia: General | Site: Neck | Wound class: Clean

## 2012-08-27 MED ORDER — MIDAZOLAM HCL 5 MG/5ML IJ SOLN
INTRAMUSCULAR | Status: DC | PRN
  Administered 2012-08-27: 1 mg via INTRAVENOUS

## 2012-08-27 MED ORDER — CARVEDILOL 12.5 MG PO TABS
12.5000 mg | ORAL_TABLET | Freq: Two times a day (BID) | ORAL | Status: DC
  Administered 2012-08-27 – 2012-08-28 (×2): 12.5 mg via ORAL
  Filled 2012-08-27 (×4): qty 1

## 2012-08-27 MED ORDER — LACTATED RINGERS IV SOLN
INTRAVENOUS | Status: DC | PRN
  Administered 2012-08-27 (×2): via INTRAVENOUS

## 2012-08-27 MED ORDER — LIDOCAINE HCL 4 % MT SOLN
OROMUCOSAL | Status: DC | PRN
  Administered 2012-08-27: 4 mL via TOPICAL

## 2012-08-27 MED ORDER — ACETAMINOPHEN 10 MG/ML IV SOLN
INTRAVENOUS | Status: DC | PRN
  Administered 2012-08-27: 1000 mg via INTRAVENOUS

## 2012-08-27 MED ORDER — CEFAZOLIN SODIUM 1-5 GM-% IV SOLN
1.0000 g | Freq: Four times a day (QID) | INTRAVENOUS | Status: AC
  Administered 2012-08-27 – 2012-08-28 (×3): 1 g via INTRAVENOUS
  Filled 2012-08-27 (×4): qty 50

## 2012-08-27 MED ORDER — LACTATED RINGERS IV SOLN: INTRAVENOUS | Status: DC

## 2012-08-27 MED ORDER — PROMETHAZINE HCL 25 MG/ML IJ SOLN: 6.2500 mg | INTRAMUSCULAR | Status: DC | PRN

## 2012-08-27 MED ORDER — ONDANSETRON HCL 4 MG PO TABS: 4.0000 mg | ORAL_TABLET | Freq: Four times a day (QID) | ORAL | Status: DC | PRN

## 2012-08-27 MED ORDER — PROPOFOL 10 MG/ML IV BOLUS
INTRAVENOUS | Status: DC | PRN
  Administered 2012-08-27: 50 mg via INTRAVENOUS
  Administered 2012-08-27: 150 mg via INTRAVENOUS
  Administered 2012-08-27: 50 mg via INTRAVENOUS

## 2012-08-27 MED ORDER — PHENYLEPHRINE HCL 10 MG/ML IJ SOLN
INTRAMUSCULAR | Status: DC | PRN
  Administered 2012-08-27 (×4): 80 ug via INTRAVENOUS

## 2012-08-27 MED ORDER — GLYCOPYRROLATE 0.2 MG/ML IJ SOLN
INTRAMUSCULAR | Status: DC | PRN
  Administered 2012-08-27: .6 mg via INTRAVENOUS

## 2012-08-27 MED ORDER — LIDOCAINE HCL (CARDIAC) 20 MG/ML IV SOLN
INTRAVENOUS | Status: DC | PRN
  Administered 2012-08-27: 40 mg via INTRAVENOUS

## 2012-08-27 MED ORDER — ONDANSETRON HCL 4 MG/2ML IJ SOLN
INTRAMUSCULAR | Status: DC | PRN
  Administered 2012-08-27: 4 mg via INTRAVENOUS

## 2012-08-27 MED ORDER — LEVOTHYROXINE SODIUM 75 MCG PO TABS
75.0000 ug | ORAL_TABLET | Freq: Every morning | ORAL | Status: DC
  Administered 2012-08-28: 75 ug via ORAL
  Filled 2012-08-27 (×3): qty 1

## 2012-08-27 MED ORDER — FENTANYL CITRATE 0.05 MG/ML IJ SOLN
INTRAMUSCULAR | Status: DC | PRN
  Administered 2012-08-27 (×4): 50 ug via INTRAVENOUS

## 2012-08-27 MED ORDER — ROCURONIUM BROMIDE 100 MG/10ML IV SOLN
INTRAVENOUS | Status: DC | PRN
  Administered 2012-08-27: 35 mg via INTRAVENOUS

## 2012-08-27 MED ORDER — FENTANYL CITRATE 0.05 MG/ML IJ SOLN
25.0000 ug | INTRAMUSCULAR | Status: DC | PRN
  Administered 2012-08-27: 25 ug via INTRAVENOUS

## 2012-08-27 MED ORDER — HYDROCODONE-ACETAMINOPHEN 5-325 MG PO TABS
1.0000 | ORAL_TABLET | ORAL | Status: DC | PRN
  Administered 2012-08-28: 1 via ORAL
  Filled 2012-08-27: qty 1

## 2012-08-27 MED ORDER — MORPHINE SULFATE 2 MG/ML IJ SOLN: 2.0000 mg | INTRAMUSCULAR | Status: DC | PRN

## 2012-08-27 MED ORDER — NEOSTIGMINE METHYLSULFATE 1 MG/ML IJ SOLN
INTRAMUSCULAR | Status: DC | PRN
  Administered 2012-08-27: 4 mg via INTRAVENOUS

## 2012-08-27 MED ORDER — ONDANSETRON HCL 4 MG/2ML IJ SOLN
4.0000 mg | Freq: Four times a day (QID) | INTRAMUSCULAR | Status: DC | PRN
  Filled 2012-08-27: qty 2

## 2012-08-27 MED ORDER — SUCCINYLCHOLINE CHLORIDE 20 MG/ML IJ SOLN
INTRAMUSCULAR | Status: DC | PRN
  Administered 2012-08-27: 100 mg via INTRAVENOUS

## 2012-08-27 MED ORDER — CEFAZOLIN SODIUM-DEXTROSE 2-3 GM-% IV SOLR
2.0000 g | INTRAVENOUS | Status: AC
  Administered 2012-08-27: 2 g via INTRAVENOUS

## 2012-08-27 MED ORDER — 0.9 % SODIUM CHLORIDE (POUR BTL) OPTIME
TOPICAL | Status: DC | PRN
  Administered 2012-08-27: 1000 mL

## 2012-08-27 SURGICAL SUPPLY — 46 items
ATTRACTOMAT 16X20 MAGNETIC DRP (DRAPES) ×2 IMPLANT
BENZOIN TINCTURE PRP APPL 2/3 (GAUZE/BANDAGES/DRESSINGS) ×2 IMPLANT
BLADE HEX COATED 2.75 (ELECTRODE) ×2 IMPLANT
BLADE SURG 15 STRL LF DISP TIS (BLADE) ×1 IMPLANT
BLADE SURG 15 STRL SS (BLADE) ×1
CANISTER SUCTION 2500CC (MISCELLANEOUS) ×2 IMPLANT
CLIP TI MEDIUM 6 (CLIP) ×4 IMPLANT
CLIP TI WIDE RED SMALL 6 (CLIP) ×8 IMPLANT
CLOTH BEACON ORANGE TIMEOUT ST (SAFETY) ×2 IMPLANT
DERMABOND ADVANCED (GAUZE/BANDAGES/DRESSINGS) ×1
DERMABOND ADVANCED .7 DNX12 (GAUZE/BANDAGES/DRESSINGS) ×1 IMPLANT
DISSECTOR ROUND CHERRY 3/8 STR (MISCELLANEOUS) ×2 IMPLANT
DRAIN PENROSE 18X1/4 LTX STRL (WOUND CARE) IMPLANT
DRAPE PED LAPAROTOMY (DRAPES) ×2 IMPLANT
DRESSING TELFA 8X3 (GAUZE/BANDAGES/DRESSINGS) ×2 IMPLANT
ELECT REM PT RETURN 9FT ADLT (ELECTROSURGICAL) ×2
ELECTRODE REM PT RTRN 9FT ADLT (ELECTROSURGICAL) ×1 IMPLANT
GAUZE SPONGE 4X4 16PLY XRAY LF (GAUZE/BANDAGES/DRESSINGS) ×4 IMPLANT
GLOVE BIOGEL PI IND STRL 7.0 (GLOVE) ×1 IMPLANT
GLOVE BIOGEL PI INDICATOR 7.0 (GLOVE) ×1
GLOVE ECLIPSE 8.0 STRL XLNG CF (GLOVE) ×2 IMPLANT
GLOVE INDICATOR 8.0 STRL GRN (GLOVE) ×4 IMPLANT
GOWN STRL NON-REIN LRG LVL3 (GOWN DISPOSABLE) ×2 IMPLANT
GOWN STRL REIN XL XLG (GOWN DISPOSABLE) ×4 IMPLANT
HEMOSTAT SURGICEL 2X14 (HEMOSTASIS) ×2 IMPLANT
KIT BASIN OR (CUSTOM PROCEDURE TRAY) ×2 IMPLANT
NS IRRIG 1000ML POUR BTL (IV SOLUTION) ×2 IMPLANT
PACK BASIC VI WITH GOWN DISP (CUSTOM PROCEDURE TRAY) ×2 IMPLANT
PENCIL BUTTON HOLSTER BLD 10FT (ELECTRODE) ×2 IMPLANT
SHEARS HARMONIC 9CM CVD (BLADE) IMPLANT
SPONGE GAUZE 4X4 12PLY (GAUZE/BANDAGES/DRESSINGS) ×2 IMPLANT
STAPLER VISISTAT 35W (STAPLE) ×2 IMPLANT
STRIP CLOSURE SKIN 1/2X4 (GAUZE/BANDAGES/DRESSINGS) ×2 IMPLANT
SUT MNCRL AB 4-0 PS2 18 (SUTURE) ×2 IMPLANT
SUT SILK 2 0 (SUTURE) ×1
SUT SILK 2-0 18XBRD TIE 12 (SUTURE) ×1 IMPLANT
SUT SILK 3 0 (SUTURE)
SUT SILK 3-0 18XBRD TIE 12 (SUTURE) IMPLANT
SUT VIC AB 3-0 SH 18 (SUTURE) IMPLANT
SUT VIC AB 3-0 SH 8-18 (SUTURE) ×2 IMPLANT
SUT VICRYL 3 0 BR 18  UND (SUTURE)
SUT VICRYL 3 0 BR 18 UND (SUTURE) IMPLANT
SYR BULB IRRIGATION 50ML (SYRINGE) ×2 IMPLANT
TAPE PAPER MEDFIX 1IN X 10YD (GAUZE/BANDAGES/DRESSINGS) ×2 IMPLANT
TOWEL OR 17X26 10 PK STRL BLUE (TOWEL DISPOSABLE) ×2 IMPLANT
YANKAUER SUCT BULB TIP 10FT TU (MISCELLANEOUS) ×2 IMPLANT

## 2012-08-27 NOTE — Anesthesia Preprocedure Evaluation (Addendum)
Anesthesia Evaluation  Patient identified by MRN, date of birth, ID band Patient awake    Reviewed: Allergy & Precautions, H&P , NPO status , Patient's Chart, lab work & pertinent test results  Airway Mallampati: II TM Distance: >3 FB Neck ROM: Full    Dental   Discolored teeth.:   Pulmonary former smoker,  breath sounds clear to auscultation  Pulmonary exam normal       Cardiovascular Exercise Tolerance: Good hypertension, Pt. on medications and Pt. on home beta blockers negative cardio ROS  Rhythm:Regular Rate:Normal     Neuro/Psych negative neurological ROS  negative psych ROS   GI/Hepatic negative GI ROS, Neg liver ROS,   Endo/Other  diabetes, Type 2, Oral Hypoglycemic AgentsHypothyroidism   Renal/GU negative Renal ROS  negative genitourinary   Musculoskeletal negative musculoskeletal ROS (+)   Abdominal   Peds negative pediatric ROS (+)  Hematology negative hematology ROS (+)   Anesthesia Other Findings   Reproductive/Obstetrics negative OB ROS                          Anesthesia Physical Anesthesia Plan  ASA: III  Anesthesia Plan: General   Post-op Pain Management:    Induction: Intravenous  Airway Management Planned: Oral ETT  Additional Equipment:   Intra-op Plan:   Post-operative Plan: Extubation in OR  Informed Consent: I have reviewed the patients History and Physical, chart, labs and discussed the procedure including the risks, benefits and alternatives for the proposed anesthesia with the patient or authorized representative who has indicated his/her understanding and acceptance.   Dental advisory given  Plan Discussed with: CRNA  Anesthesia Plan Comments:         Anesthesia Quick Evaluation

## 2012-08-27 NOTE — Transfer of Care (Signed)
Immediate Anesthesia Transfer of Care Note  Patient: Alexa Lawson  Procedure(s) Performed: Procedure(s): PARATHYROIDECTOMY with neck exploration  (N/A)  Patient Location: PACU  Anesthesia Type:General  Level of Consciousness: awake, sedated and patient cooperative  Airway & Oxygen Therapy: Patient Spontanous Breathing and Patient connected to face mask oxygen  Post-op Assessment: Report given to PACU RN, Post -op Vital signs reviewed and stable and Patient moving all extremities X 4  Post vital signs: stable  Complications: No apparent anesthesia complications

## 2012-08-27 NOTE — H&P (Signed)
Alexa Lawson is an 79 y.o. female.   Chief Complaint: Here for parathyroidectomy HPI:   She had excision of bilateral parathyroid adenomas in 2005 in [place]. There was a right inferior parathyroid adenoma. The left inferior sampling was suggestive of a parathyroid adenoma. She was symptomatic prior to surgery and stating she was tired all the time and slept a lot. After the surgery she "woke up." Recently, she's becoming tired again like she was prior to the previous surgery. She's been noted to have an elevated calcium level of 10.9. A sestamibi scan has been done as well which demonstrates abnormal uptake in the left neck consistent with a parathyroid adenoma.  Her PTH is significantly elevated as well consistent with recurrent hyperparathyroidism.   Past Medical History  Diagnosis Date  . Hypertension   . Diabetes mellitus   . Diverticulosis   . Anemia   . Hypothyroidism     Past Surgical History  Procedure Laterality Date  . Parathyroid sx  2005  . Colonoscopy  10/13/2011    Procedure: COLONOSCOPY;  Surgeon: Juanita Craver, MD;  Location: [facility] ENDOSCOPY;  Service: Endoscopy;  Laterality: N/A;  . Abdominal hysterectomy  07/1984  . Cataract surgery with lens implants  1998 and 2000    both eyes    Family History  Problem Relation Age of Onset  . Cancer Mother   . Diabetes Maternal Uncle   . Diabetes Maternal Uncle    Social History:  reports that she quit smoking about 15 years ago. Her smoking use included Cigarettes. She has a 8.75 pack-year smoking history. She has never used smokeless tobacco. She reports that she does not drink alcohol or use illicit drugs.  Allergies: No Known Allergies  Medications Prior to Admission  Medication Sig Dispense Refill  . amLODipine-valsartan (EXFORGE) 5-320 MG per tablet Take 1 tablet by mouth every evening.       . carvedilol (COREG) 12.5 MG tablet Take 12.5 mg by mouth 2 (two) times daily with a meal.      . Iron-Vitamin C  (VITRON-C) 65-125 MG TABS Take 2 tablets by mouth daily.       Marland Kitchen levothyroxine (SYNTHROID, LEVOTHROID) 75 MCG tablet Take 75 mcg by mouth every morning.       . metFORMIN (GLUCOPHAGE) 1000 MG tablet Take 1,000 mg by mouth every evening.       . Multiple Vitamins-Minerals (MULTIVITAMIN WITH MINERALS) tablet Take 1 tablet by mouth daily.      . Vitamin D, Ergocalciferol, (DRISDOL) 50000 UNITS CAPS Take 50,000 Units by mouth every 14 (fourteen) days.         Results for orders placed during the hospital encounter of 08/27/12 (from the past 48 hour(s))  GLUCOSE, CAPILLARY     Status: Abnormal   Collection Time    08/27/12  5:50 AM      Result Value Range   Glucose-Capillary 108 (*) 70 - 99 mg/dL   No results found.  Review of Systems  Constitutional: Negative for fever and chills.  HENT: Negative for congestion and sore throat.   Gastrointestinal: Positive for nausea. Negative for vomiting and diarrhea.    Blood pressure 159/74, pulse 94, temperature 97.4 F (36.3 C), temperature source Oral, resp. rate 18, SpO2 97.00%. Physical Exam  Constitutional:  Obese female in NAD  HENT:  Head: Normocephalic and atraumatic.  Eyes: EOM are normal.  Neck:  Lower transverse scar.  No masses.  Cardiovascular: Normal rate and regular rhythm.  Respiratory: Effort normal and breath sounds normal.  GI: Soft. There is no tenderness.  Musculoskeletal: She exhibits no edema.  Skin: Skin is warm and dry.  Psychiatric: She has a normal mood and affect. Her behavior is normal.     Assessment/Plan Recurrent hyperparathyroidism secondary to parathyroid adenoma.  Plan:  Neck exploration, parathyroidectomy.    Farren Nelles J 08/27/2012, 7:23 AM

## 2012-08-27 NOTE — Anesthesia Postprocedure Evaluation (Signed)
  Anesthesia Post-op Note  Patient: Alexa Lawson  Procedure(s) Performed: Procedure(s) (LRB): PARATHYROIDECTOMY with neck exploration  (N/A)  Patient Location: PACU  Anesthesia Type: General  Level of Consciousness: awake and alert   Airway and Oxygen Therapy: Patient Spontanous Breathing  Post-op Pain: mild  Post-op Assessment: Post-op Vital signs reviewed, Patient's Cardiovascular Status Stable, Respiratory Function Stable, Patent Airway and No signs of Nausea or vomiting  Last Vitals:  Filed Vitals:   08/27/12 0945  BP: 163/93  Pulse: 70  Temp: 36.1 C  Resp: 11    Post-op Vital Signs: stable   Complications: No apparent anesthesia complications

## 2012-08-27 NOTE — Op Note (Signed)
Operative Note  MANAAL MANDALA female 79 y.o. 08/27/2012  PREOPERATIVE DX:  Recurrent hyperparathyroidism  POSTOPERATIVE DX:  Same  PROCEDURE: Redo neck exploration, left superior parathyroidectomy         Surgeon: Odis Hollingshead   Assistants: Armandina Gemma M.D.  Anesthesia: General endotracheal anesthesia  Indications:   This is a 79 year old female who had excision of right parathyroid adenoma and biopsy of left parathyroid gland 2005 in [place].  She had similar symptoms recently she had prior to that episode. She was found to have an elevated calcium level and elevated parathyroid hormone level. A sestamibi scan demonstrates abnormal on to take in the left neck consistent with a parathyroid adenoma.  She now presents for the above procedure.    Procedure Detail:  She was brought to the operating room placed supine on the operating table and general anesthetic was given. The neck was placed in slight extension. In the neck and upper chest were sterilely prepped and draped.  Previous transverse scar the neck was re-incised sharply. The subcutaneous tissues and platysma were divided with electrocautery. Subplatysmal flaps are raised superiorly to the thyroid cartilage and inferiorly to the suprasternal notch.  Scar tissue between the strap muscles was divided. The left strap muscle was densely adherent to the left lobe of the thyroid gland and this is dissected free using careful blunt dissection and electrocautery.  Dissection was kept on the thyroid capsule. The left recurrent laryngeal nerve was identified and carefully kept out of plane of dissection. The left lobe of the thyroid gland was rotated up. An abnormal fullness was felt near the superior pole. Careful dissection demonstrated what appeared to be a significantly large superior parathyroid gland. Using careful dissection the blood supply to this was found that and was clipped and then divided.  The enlarged  parathyroid gland was then removed and sent to pathology. Frozen section demonstrated hypercellular parathyroid tissue consistent with adenoma.  The wound was inspected and was hemostatic. Surgicel was placed in the wound. The strap muscles were then reapproximated with 3-0 Vicryl sutures. The platysma was reapproximated over the sutures. The skin was closed with running 4-0 Monocryl subcuticular stitch. Dermabond and a sterile dressing were applied.  She tolerated the procedure well the apparent complications and was taken to recovery in satisfactory condition.  Estimated Blood Loss:  less than 100 mL         Drains: none   Blood Given: none          Specimens: Left parathyroid adenoma        Complications:  * No complications entered in OR log *         Disposition: PACU - hemodynamically stable.         Condition: stable

## 2012-08-28 ENCOUNTER — Encounter (HOSPITAL_COMMUNITY): Payer: Self-pay | Admitting: General Surgery

## 2012-08-28 LAB — GLUCOSE, CAPILLARY

## 2012-08-28 LAB — BASIC METABOLIC PANEL
BUN: 10 mg/dL (ref 6–23)
Creatinine, Ser: 1.15 mg/dL — ABNORMAL HIGH (ref 0.50–1.10)
GFR calc Af Amer: 52 mL/min — ABNORMAL LOW (ref >90)
GFR calc non Af Amer: 45 mL/min — ABNORMAL LOW (ref >90)
Glucose, Bld: 111 mg/dL — ABNORMAL HIGH (ref 70–99)

## 2012-08-28 MED ORDER — HYDROCODONE-ACETAMINOPHEN 5-325 MG PO TABS: 1.0000 | ORAL_TABLET | ORAL | Status: DC | PRN

## 2012-08-28 NOTE — Progress Notes (Signed)
1 Day Post-Op  Subjective: Some mild neck pain.  Objective: Vital signs in last 24 hours: Temp:  [97 F (36.1 C)-98.7 F (37.1 C)] 98.4 F (36.9 C) (04/11 0537) Pulse Rate:  [64-93] 85 (04/11 0537) Resp:  [11-20] 20 (04/11 0537) BP: (128-182)/(62-98) 143/75 mmHg (04/11 0537) SpO2:  [92 %-100 %] 100 % (04/11 0537) Weight:  [221 lb 9.5 oz (100.513 kg)] 221 lb 9.5 oz (100.513 kg) (04/10 1033)    Intake/Output from previous day: 04/10 0701 - 04/11 0700 In: 1520 [P.O.:120; I.V.:1350; IV Piggyback:50] Out: 2950 [Urine:2900; Blood:50] Intake/Output this shift: Total I/O In: -  Out: 2250 [Urine:2250]  PE: General- In NAD Neck-dressing dry, no significant swelling, voice strong  Lab Results:  No results found for this basename: WBC, HGB, HCT, PLT,  in the last 72 hours BMET  Recent Labs  08/27/12 1830 08/28/12 0410  NA  --  139  K  --  3.9  CL  --  103  CO2  --  27  GLUCOSE  --  111*  BUN  --  10  CREATININE  --  1.15*  CALCIUM 9.5 9.2   PT/INR No results found for this basename: LABPROT, INR,  in the last 72 hours Comprehensive Metabolic Panel:    Component Value Date/Time   NA 139 08/28/2012 0410   K 3.9 08/28/2012 0410   CL 103 08/28/2012 0410   CO2 27 08/28/2012 0410   BUN 10 08/28/2012 0410   CREATININE 1.15* 08/28/2012 0410   GLUCOSE 111* 08/28/2012 0410   CALCIUM 9.2 08/28/2012 0410   AST 15 08/19/2012 0935   ALT 8 08/19/2012 0935   ALKPHOS 94 08/19/2012 0935   BILITOT 0.3 08/19/2012 0935   PROT 7.6 08/19/2012 0935   ALBUMIN 3.4* 08/19/2012 0935     Studies/Results: No results found.  Anti-infectives: Anti-infectives   Start     Dose/Rate Route Frequency Ordered Stop   08/27/12 1300  ceFAZolin (ANCEF) IVPB 1 g/50 mL premix     1 g 100 mL/hr over 30 Minutes Intravenous Every 6 hours 08/27/12 1047 08/28/12 0133   08/27/12 0536  ceFAZolin (ANCEF) IVPB 2 g/50 mL premix     2 g 100 mL/hr over 30 Minutes Intravenous On call to O.R. 08/27/12 0536 08/27/12 0732       Assessment Active Problems:  1.  Recurrent hyperPTH s/p parathyroidectomy-doing well; Calcium down to 9.2.  2.  Pulmonary fibrosis on CXR-she has been having some dyspnea with everyday activities recently.    LOS: 1 day   Plan: Discharge.  Instructions given.  Will give her the phone number to [facility] so she can make an appointment with them.   Dmitry Macomber J 08/28/2012

## 2012-09-02 ENCOUNTER — Ambulatory Visit (INDEPENDENT_AMBULATORY_CARE_PROVIDER_SITE_OTHER): Payer: Medicare Other | Admitting: Internal Medicine

## 2012-09-02 ENCOUNTER — Encounter: Payer: Self-pay | Admitting: Internal Medicine

## 2012-09-02 VITALS — BP 136/80 | HR 86 | Temp 96.9°F | Ht 67.0 in | Wt 221.8 lb

## 2012-09-02 DIAGNOSIS — R9389 Abnormal findings on diagnostic imaging of other specified body structures: Secondary | ICD-10-CM

## 2012-09-02 DIAGNOSIS — IMO0001 Reserved for inherently not codable concepts without codable children: Secondary | ICD-10-CM | POA: Insufficient documentation

## 2012-09-02 DIAGNOSIS — R918 Other nonspecific abnormal finding of lung field: Secondary | ICD-10-CM

## 2012-09-02 DIAGNOSIS — R0600 Dyspnea, unspecified: Secondary | ICD-10-CM

## 2012-09-02 DIAGNOSIS — R0989 Other specified symptoms and signs involving the circulatory and respiratory systems: Secondary | ICD-10-CM

## 2012-09-02 NOTE — Progress Notes (Signed)
  Subjective:    Patient ID: Alexa Lawson, female    DOB: 1934/02/26  MRN: [id number]  HPI  80 yobf quit smoking 1998 around with no resp problems at all with cough and sob onset 2013 referred to pulmonary clinic 09/02/2012 by Dr Barkley Bruns with abn cxr periop for parathyroidectomy  09/02/2012 1st pulmonary eval cc indolent onset sob with dry hacking cough x 8-12 months   varied some with wt gain house work and gets sob if does more than slow pace at [place]  Has year round nose stuffy and runs year round clear x 15y  No obvious daytime variabilty or assoc   cp or chest tightness, subjective wheeze overt sinus or hb symptoms. No unusual exp hx or h/o childhood pna/ asthma or premature birth to her knowledge.  Sleeping ok without nocturnal  or early am exacerbation  of respiratory  c/o's or need for noct saba. Also denies any obvious fluctuation of symptoms with weather or environmental changes or other aggravating or alleviating factors except as outlined above     Review of Systems  Constitutional: Negative for fever, chills and unexpected weight change.  HENT: Negative for ear pain, nosebleeds, congestion, sore throat, rhinorrhea, sneezing, trouble swallowing, dental problem, voice change, postnasal drip and sinus pressure.   Eyes: Negative for visual disturbance.  Respiratory: Positive for cough and shortness of breath. Negative for choking.   Cardiovascular: Negative for chest pain and leg swelling.  Gastrointestinal: Negative for vomiting, abdominal pain and diarrhea.  Genitourinary: Negative for difficulty urinating.  Musculoskeletal: Negative for arthralgias.  Skin: Negative for rash.  Neurological: Negative for tremors, syncope and headaches.  Hematological: Does not bruise/bleed easily.       Objective:   Physical Exam  Wt Readings from Last 3 Encounters:  09/02/12 221 lb 12.8 oz (100.608 kg)  08/27/12 221 lb 9.5 oz (100.513 kg)  08/27/12 221 lb 9.5 oz (100.513 kg)     HEENT: nl dentition, turbinates, and orophanx. Nl external ear canals without cough reflex   NECK :  without JVD/Nodes/TM/ nl carotid upstrokes bilaterally   LUNGS: no acc muscle use, clear to A and P bilaterally without cough on insp or exp maneuvers   CV:  RRR  no s3 or murmur or increase in P2, no edema   ABD:  soft and nontender with nl excursion in the supine position. No bruits or organomegaly, bowel sounds nl  MS:  warm without deformities, calf tenderness, cyanosis or clubbing  SKIN: warm and dry without lesions    NEURO:  alert, approp, no deficits      08/19/12 cxr Increased lung markings in both lung bases, favor pulmonary  fibrosis however atelectasis or pneumonia is possible     Assessment & Plan:

## 2012-09-02 NOTE — Patient Instructions (Addendum)
Weight control is simply a matter of calorie balance which needs to be tilted in your favor by eating less and exercising more.  To get the most out of exercise, you need to be continuously aware that you are short of breath, but never out of breath, for 30 minutes daily. As you improve, it will actually be easier for you to do the same amount of exercise  in  30 minutes so always push to the level where you are short of breath.  If this does not result in gradual weight reduction then I strongly recommend you see a nutritionist with a food diary x 2 weeks so that we can work out a negative calorie balance which is universally effective in steady weight loss programs.  Think of your calorie balance like you do your bank account where in this case you want the balance to go down so you must take in less calories than you burn up.  It's just that simple:  Hard to do, but easy to understand.  Good luck!   GERD (REFLUX)  is an extremely common cause of respiratory symptoms, many times with no significant heartburn at all.    It can be treated with medication, but also with lifestyle changes including avoidance of late meals, excessive alcohol, smoking cessation, and avoid fatty foods, chocolate, peppermint, colas, red wine, and acidic juices such as orange juice.  NO MINT OR MENTHOL PRODUCTS SO NO COUGH DROPS  USE SUGARLESS CANDY INSTEAD (jolley ranchers or Stover's)  NO OIL BASED VITAMINS - use powdered substitutes.     Please schedule a follow up office visit in 6 weeks, call sooner if needed with pft's and cxr on return

## 2012-09-04 DIAGNOSIS — R9389 Abnormal findings on diagnostic imaging of other specified body structures: Secondary | ICD-10-CM | POA: Insufficient documentation

## 2012-09-04 NOTE — Assessment & Plan Note (Signed)
-  09/02/2012  Walked RA x 3 laps @ 185 ft each stopped due to end of study, no desats  Symptoms are markedly disproportionate to objective findings and not clear this is a lung problem but pt does appear to have difficult airway management issues. DDX of  difficult airways managment all start with A and  include Adherence, Ace Inhibitors, Acid Reflux, Active Sinus Disease, Alpha 1 Antitripsin deficiency, Anxiety masquerading as Airways dz,  ABPA,  allergy(esp in young), Aspiration (esp in elderly), Adverse effects of DPI,  Active smokers, plus two Bs  = Bronchiectasis and Beta blocker use..and one C= CHF  Adherence is always the initial "prime suspect" and is a multilayered concern that requires a "trust but verify" approach in every patient - starting with knowing how to use medications, especially inhalers, correctly, keeping up with refills and understanding the fundamental difference between maintenance and prns vs those medications only taken for a very short course and then stopped and not refilled.   ? Acid reflux > max dietary rx reviewed  ? Active sinus dz > x 15 years by hx but no recent changes in upper airway symptoms per pt  ? Anxiety > dx of exclusion  Most likely this is obesity / deconditioning post op

## 2012-09-04 NOTE — Assessment & Plan Note (Signed)
cxr's reviewed and are c/w post op atx > rx IS and f/u with pft's cxr planned

## 2012-09-16 ENCOUNTER — Ambulatory Visit (INDEPENDENT_AMBULATORY_CARE_PROVIDER_SITE_OTHER): Payer: Medicare Other | Admitting: General Surgery

## 2012-09-16 VITALS — BP 190/80 | HR 78 | Resp 18 | Ht 67.0 in | Wt 219.2 lb

## 2012-09-16 DIAGNOSIS — Z9889 Other specified postprocedural states: Secondary | ICD-10-CM

## 2012-09-16 NOTE — Patient Instructions (Signed)
Activities as tolerated.

## 2012-09-16 NOTE — Progress Notes (Signed)
Procedure:  Redo neck exploration and left superior parathyroidectomy for hyperparathyroidism  Date:  08/27/2012  Pathology:  Parathyroid adenoma measuring 3.2 cm and weighing 3 g.  History:  She is here for first postoperative visit and feels much better overall.  Exam: General- Is in NAD. Neck-incision is clean and intact with minimal swelling. Voice is strong.  Assessment:  Doing well following parathyroidectomy for recurrent hyperparathyroidism.  Plan:  Activities as tolerated. Return in 6 weeks. We'll check calcium level at that time.

## 2012-10-20 ENCOUNTER — Ambulatory Visit (INDEPENDENT_AMBULATORY_CARE_PROVIDER_SITE_OTHER): Payer: Medicare Other | Admitting: Internal Medicine

## 2012-10-20 ENCOUNTER — Ambulatory Visit (INDEPENDENT_AMBULATORY_CARE_PROVIDER_SITE_OTHER)
Admission: RE | Admit: 2012-10-20 | Discharge: 2012-10-20 | Disposition: A | Discharge status: Home / Self Care | Payer: Medicare Other | Source: Ambulatory Visit | Attending: Internal Medicine | Admitting: Internal Medicine

## 2012-10-20 ENCOUNTER — Encounter: Payer: Self-pay | Admitting: Internal Medicine

## 2012-10-20 VITALS — BP 124/80 | HR 77 | Temp 98.0°F | Ht 67.0 in | Wt 218.0 lb

## 2012-10-20 DIAGNOSIS — R0600 Dyspnea, unspecified: Secondary | ICD-10-CM

## 2012-10-20 DIAGNOSIS — R0989 Other specified symptoms and signs involving the circulatory and respiratory systems: Secondary | ICD-10-CM

## 2012-10-20 DIAGNOSIS — R0609 Other forms of dyspnea: Secondary | ICD-10-CM

## 2012-10-20 LAB — PULMONARY FUNCTION TEST

## 2012-10-20 MED ORDER — FAMOTIDINE 20 MG PO TABS: ORAL_TABLET | ORAL | Status: DC

## 2012-10-20 MED ORDER — PANTOPRAZOLE SODIUM 40 MG PO TBEC: 40.0000 mg | DELAYED_RELEASE_TABLET | Freq: Every day | ORAL | Status: DC

## 2012-10-20 NOTE — Progress Notes (Signed)
PFT done today. 

## 2012-10-20 NOTE — Assessment & Plan Note (Addendum)
-  09/02/2012  Walked RA x 3 laps @ 185 ft each stopped due to end of study, no desats - PFT's 10/20/2012 FEV1  VC 2.19 (88%) with no airflow obst and DLCO 45 corrects to 66%  DDx for pulmonary fibrosis  includes idiopathic pulmonary fibrosis, pulmonary fibrosis associated with rheumatologic diseases (which have a relatively benign course in most cases) , adverse effect from  drugs such as chemotherapy or amiodarone exposure, nonspecific interstitial pneumonia which is typically steroid responsive, and chronic hypersensitivity pneumonitis.   In active  smokers Langerhan's Cell  Histiocyctosis (eosinophilic granuomatosis),  DIP,  and Respiratory Bronchiolitis ILD also need to be considered,    Use of PPI is associated with improved survival time and with decreased radiologic fibrosis per King's study published in AJRCCM vol 184 p1390.  Dec 2011  This may not be cause and effect, but given how universally unhelpful all the otherstudy drugs have been for pf,   rec start  rx ppi / diet/ lifestyle modification.   Will see how she does over 3 months and then regroup with extensive w/u if any progression on gerd rx plus see if this helps the cough, which may or may not be related to ILD  See instructions for specific recommendations which were reviewed directly with the patient who was given a copy with highlighter outlining the key components.

## 2012-10-20 NOTE — Progress Notes (Signed)
  Subjective:    Patient ID: Alexa Lawson, female    DOB: January 26, 1934  MRN: [id number]  HPI  62 yobf quit smoking 1998 around with no resp problems at all with cough and sob onset 2013 referred to pulmonary clinic 09/02/2012 by Dr Barkley Bruns with abn cxr periop for parathyroidectomy  09/02/2012 1st pulmonary eval cc indolent onset sob with dry hacking cough x 8-12 months   varied some with wt gain house work and gets sob if does more than slow pace at basline Has year round nose stuffy and runs year round clear x 15 y rec Wt loss, gerd diet   10/20/2012 f/u ov/[name] re ? ILD Chief Complaint  Patient presents with  . Follow-up    Breathing doing the same. No new co's today.    not really limited by breathing from doing desired activities. Main complaint is chronic dry hacking daytime cough  No obvious daytime variabilty or assoc purulent sputum or cp or chest tightness, subjective wheeze overt sinus or hb symptoms. No unusual exp hx or h/o childhood pna/ asthma or premature birth to her knowledge.  Sleeping ok without nocturnal  or early am exacerbation  of respiratory  c/o's or need for noct saba. Also denies any obvious fluctuation of symptoms with weather or environmental changes or other aggravating or alleviating factors except as outlined above    Current Medications, Allergies, Past Medical History, Past Surgical History, Family History, and Social History were reviewed in [employer] record.  ROS  The following are not active complaints unless bolded sore throat, dysphagia, dental problems, itching, sneezing,  nasal congestion or excess/ purulent secretions, ear ache,   fever, chills, sweats, unintended wt loss, pleuritic or exertional cp, hemoptysis,  orthopnea pnd or leg swelling, presyncope, palpitations, heartburn, abdominal pain, anorexia, nausea, vomiting, diarrhea  or change in bowel or urinary habits, change in stools or urine, dysuria,hematuria,   rash, arthralgias, visual complaints, headache, numbness weakness or ataxia or problems with walking or coordination,  change in mood/affect or memory.            Objective:   Physical Exam  10/20/2012         218  Wt Readings from Last 3 Encounters:  09/02/12 221 lb 12.8 oz (100.608 kg)  08/27/12 221 lb 9.5 oz (100.513 kg)  08/27/12 221 lb 9.5 oz (100.513 kg)    HEENT: nl dentition, turbinates, and orophanx. Nl external ear canals without cough reflex   NECK :  without JVD/Nodes/TM/ nl carotid upstrokes bilaterally   LUNGS: no acc muscle use, clear to A and P bilaterally without cough on insp or exp maneuvers   CV:  RRR  no s3 or murmur or increase in P2, no edema   ABD:  soft and nontender with nl excursion in the supine position. No bruits or organomegaly, bowel sounds nl  MS:  warm without deformities, calf tenderness, cyanosis or clubbing         CXR  10/20/2012 :   1 Cardiomegaly. 2. Increased bilateral lower lobe infiltrates. Findings are most consistent with infectious process or pulmonary edema.      Assessment & Plan:

## 2012-10-20 NOTE — Patient Instructions (Addendum)
Pantoprazole (protonix) 40 mg   Take 30-60 min before first meal of the day and Pepcid 20 mg one bedtime until return to office - this is the best way to tell whether stomach acid is contributing to your problem.    GERD (REFLUX)  is an extremely common cause of respiratory symptoms, many times with no significant heartburn at all.    It can be treated with medication, but also with lifestyle changes including avoidance of late meals, excessive alcohol, smoking cessation, and avoid fatty foods, chocolate, peppermint, colas, red wine, and acidic juices such as orange juice.  NO MINT OR MENTHOL PRODUCTS SO NO COUGH DROPS  USE SUGARLESS CANDY INSTEAD (jolley ranchers or Stover's)  NO OIL BASED VITAMINS - use powdered substitutes.    Please schedule a follow up visit in 3 months but call sooner if needed

## 2012-10-28 ENCOUNTER — Encounter: Payer: Self-pay | Admitting: Internal Medicine

## 2012-10-28 ENCOUNTER — Encounter (INDEPENDENT_AMBULATORY_CARE_PROVIDER_SITE_OTHER): Payer: Self-pay | Admitting: General Surgery

## 2012-10-28 ENCOUNTER — Ambulatory Visit (INDEPENDENT_AMBULATORY_CARE_PROVIDER_SITE_OTHER): Payer: Medicare Other | Admitting: General Surgery

## 2012-10-28 ENCOUNTER — Other Ambulatory Visit (INDEPENDENT_AMBULATORY_CARE_PROVIDER_SITE_OTHER): Payer: Self-pay | Admitting: General Surgery

## 2012-10-28 VITALS — BP 168/88 | HR 88 | Resp 16 | Ht 67.0 in | Wt 217.0 lb

## 2012-10-28 DIAGNOSIS — Z9889 Other specified postprocedural states: Secondary | ICD-10-CM

## 2012-10-28 DIAGNOSIS — E213 Hyperparathyroidism, unspecified: Secondary | ICD-10-CM

## 2012-10-28 LAB — CALCIUM: Calcium: 8.7 mg/dL (ref 8.4–10.5)

## 2012-10-28 NOTE — Patient Instructions (Signed)
May use Mederma on your scar.

## 2012-10-28 NOTE — Progress Notes (Signed)
Procedure:  Redo neck exploration and left superior parathyroidectomy for hyperparathyroidism  Date:  08/27/2012  Pathology:  Parathyroid adenoma measuring 3.2 cm and weighing 3 g.  History:  She is here for her second post operative visit and feels well. Exam: General- Is in NAD. Neck-incision is clean and intact with not swelling.  Assessment:  Continues to do well following parathyroidectomy for recurrent hyperparathyroidism.  Plan: Check calcium level today. She is due to see Dr. Carlis Abbott in September. I will leave that up to his discretion and check calcium level and possible parathyroid hormone level. Return visit here as needed.

## 2012-10-29 ENCOUNTER — Telehealth (INDEPENDENT_AMBULATORY_CARE_PROVIDER_SITE_OTHER): Payer: Self-pay

## 2012-10-29 NOTE — Telephone Encounter (Signed)
Noted.  Thank you.

## 2012-10-29 NOTE — Telephone Encounter (Signed)
Pt notified Ca level normal.

## 2013-01-20 ENCOUNTER — Encounter: Payer: Self-pay | Admitting: Internal Medicine

## 2013-01-20 ENCOUNTER — Ambulatory Visit (INDEPENDENT_AMBULATORY_CARE_PROVIDER_SITE_OTHER): Payer: Medicare Other | Admitting: Internal Medicine

## 2013-01-20 VITALS — BP 130/70 | HR 70 | Temp 97.1°F | Ht 67.0 in | Wt 219.4 lb

## 2013-01-20 DIAGNOSIS — J8410 Pulmonary fibrosis, unspecified: Secondary | ICD-10-CM

## 2013-01-20 DIAGNOSIS — IMO0001 Reserved for inherently not codable concepts without codable children: Secondary | ICD-10-CM

## 2013-01-20 NOTE — Patient Instructions (Addendum)
Please see patient coordinator before you leave today to have your [place] doctor fax [place] all your cxr and CT reports  Continue acid suppression and diet as before.   Please schedule a follow up visit in 3 months but call sooner if needed with pft's and cxr  - also needs walking sats on return

## 2013-01-20 NOTE — Progress Notes (Signed)
Subjective:    Patient ID: Alexa Lawson, female    DOB: 1933/06/07  MRN: [id number]    Brief patient profile:  40 yobf quit smoking 1998  with no resp problems until  1990's cough ? From ACEi then 2006 bad pneumonia in ICU Dr Owens Shark in [facility] 100% improved then  onset   cough and sob onset 2013 referred to pulmonary clinic 09/02/2012 by Dr Barkley Bruns with abn cxr periop for parathyroidectomy   09/02/2012 1st pulmonary eval cc indolent onset sob with dry hacking cough x 8-12 months   varied some with wt gain house work and gets sob if does more than slow pace at basline Has year round nose stuffy and runs year round clear x 15 y rec Wt loss, gerd diet   10/20/2012 f/u ov/Deloris Moger re ? ILD Chief Complaint  Patient presents with  . Follow-up    Breathing doing the same. No new co's today.   not really limited by breathing from doing desired activities. Main complaint is chronic dry hacking daytime cough rec Pantoprazole (protonix) 40 mg   Take 30-60 min before first meal of the day and Pepcid 20 mg one bedtime until return to office  GERD diet   01/20/2013 f/u ov/Kayvion Arneson re ? ILD  Chief Complaint  Patient presents with  . Follow-up    Pt states breathing is unchanged and denies any new co's. Cough has improved some.  not limited by breathing from desired activities including "chasing my grandchildren around" Cough is dry and mostly with talking some better No h/o macrodantin exp, no h/o connective tissue dz.   No obvious daytime variabilty or assoc purulent sputum or cp or chest tightness, subjective wheeze overt sinus or hb symptoms. No unusual exp hx or h/o childhood pna/ asthma or premature birth to her knowledge.  Sleeping ok without nocturnal  or early am exacerbation  of respiratory  c/o's or need for noct saba. Also denies any obvious fluctuation of symptoms with weather or environmental changes or other aggravating or alleviating factors except as outlined above     Current Medications, Allergies, Past Medical History, Past Surgical History, Family History, and Social History were reviewed in [employer] record.  ROS  The following are not active complaints unless bolded sore throat, dysphagia, dental problems, itching, sneezing,  nasal congestion or excess/ purulent secretions, ear ache,   fever, chills, sweats, unintended wt loss, pleuritic or exertional cp, hemoptysis,  orthopnea pnd or leg swelling, presyncope, palpitations, heartburn, abdominal pain, anorexia, nausea, vomiting, diarrhea  or change in bowel or urinary habits, change in stools or urine, dysuria,hematuria,  rash, arthralgias, visual complaints, headache, numbness weakness or ataxia or problems with walking or coordination,  change in mood/affect or memory.            Objective:   Physical Exam  10/20/2012         218  > 01/20/2013   219  Wt Readings from Last 3 Encounters:  09/02/12 221 lb 12.8 oz (100.608 kg)  08/27/12 221 lb 9.5 oz (100.513 kg)  08/27/12 221 lb 9.5 oz (100.513 kg)    HEENT: nl dentition, turbinates, and orophanx. Nl external ear canals without cough reflex   NECK :  without JVD/Nodes/TM/ nl carotid upstrokes bilaterally   LUNGS: no acc muscle use, trace crackles R > L    CV:  RRR  no s3 or murmur or increase in P2, no edema   ABD:  soft and nontender with  nl excursion in the supine position. No bruits or organomegaly, bowel sounds nl  MS:  warm without deformities, calf tenderness, cyanosis or clubbing         CXR  10/20/2012 :   1 Cardiomegaly. 2. Increased bilateral lower lobe infiltrates. Findings are most consistent with infectious process or pulmonary edema.      Assessment & Plan:

## 2013-01-24 NOTE — Assessment & Plan Note (Addendum)
-  Onset of symptoms 2013 with no baseline cxr available - 09/02/2012  Walked RA x 3 laps @ 185 ft each stopped due to end of study, no desats - PFT's 10/20/2012 FEV1  VC 2.19 (88%) with no airflow obst and DLCO 45 corrects to 66%  At this point she has no symptoms attributable to PF and I strongly suspect this is residual ALI/pf from previous admit for severe pna requiring ICU admit so need old xrays/ ct but no change in rx which is directed at eliminating what is most c/w upper airway cough/ occult GERD    Each maintenance medication was reviewed in detail including most importantly the difference between maintenance and as needed and under what circumstances the prns are to be used.  Please see instructions for details which were reviewed in writing and the patient given a copy.

## 2013-06-18 ENCOUNTER — Other Ambulatory Visit: Payer: Self-pay | Admitting: Internal Medicine

## 2013-06-18 ENCOUNTER — Encounter: Payer: Self-pay | Admitting: Internal Medicine

## 2013-06-18 ENCOUNTER — Ambulatory Visit (INDEPENDENT_AMBULATORY_CARE_PROVIDER_SITE_OTHER)
Admission: RE | Admit: 2013-06-18 | Discharge: 2013-06-18 | Disposition: A | Discharge status: Home / Self Care | Payer: Medicare Other | Source: Ambulatory Visit | Attending: Internal Medicine | Admitting: Internal Medicine

## 2013-06-18 ENCOUNTER — Ambulatory Visit (INDEPENDENT_AMBULATORY_CARE_PROVIDER_SITE_OTHER): Payer: Medicare Other | Admitting: Internal Medicine

## 2013-06-18 VITALS — BP 160/110 | HR 69 | Temp 97.7°F | Ht 66.0 in | Wt 211.0 lb

## 2013-06-18 DIAGNOSIS — IMO0001 Reserved for inherently not codable concepts without codable children: Secondary | ICD-10-CM

## 2013-06-18 DIAGNOSIS — J8410 Pulmonary fibrosis, unspecified: Secondary | ICD-10-CM

## 2013-06-18 DIAGNOSIS — I10 Essential (primary) hypertension: Secondary | ICD-10-CM

## 2013-06-18 LAB — PULMONARY FUNCTION TEST
DL/VA % pred: 68 %
DL/VA: 3.47 ml/min/mmHg/L
DLCO unc % pred: 46 %
DLCO unc: 12.56 ml/min/mmHg
FEF 25-75 Post: 3.21 L/s
FEF 25-75 Pre: 2.64 L/s
FEF2575-%Change-Post: 21 %
FEF2575-%Pred-Post: 205 %
FEF2575-%Pred-Pre: 169 %
FEV1-%Change-Post: 4 %
FEV1-%Pred-Post: 106 %
FEV1-%Pred-Pre: 102 %
FEV1-Post: 1.94 L
FEV1-Pre: 1.86 L
FEV1FVC-%Change-Post: 0 %
FEV1FVC-%Pred-Pre: 113 %
FEV6-%Change-Post: 3 %
FEV6-%Pred-Post: 98 %
FEV6-%Pred-Pre: 94 %
FEV6-Post: 2.22 L
FEV6-Pre: 2.14 L
FEV6FVC-%Change-Post: 0 %
FEV6FVC-%Pred-Post: 103 %
FEV6FVC-%Pred-Pre: 104 %
FVC-%Change-Post: 4 %
FVC-%Pred-Post: 95 %
FVC-%Pred-Pre: 91 %
FVC-Post: 2.24 L
FVC-Pre: 2.15 L
Post FEV1/FVC ratio: 87 %
Post FEV6/FVC ratio: 100 %
Pre FEV1/FVC ratio: 86 %
Pre FEV6/FVC Ratio: 100 %
RV % pred: 65 %
RV: 1.59 L
TLC % pred: 74 %
TLC: 3.96 L

## 2013-06-18 MED ORDER — PANTOPRAZOLE SODIUM 40 MG PO TBEC: 40.0000 mg | DELAYED_RELEASE_TABLET | Freq: Every day | ORAL | Status: DC

## 2013-06-18 MED ORDER — FAMOTIDINE 20 MG PO TABS: ORAL_TABLET | ORAL | Status: DC

## 2013-06-18 NOTE — Patient Instructions (Addendum)
Resume Pantoprazole (protonix) 40 mg   Take 30-60 min before first meal of the day and Pepcid 20 mg one bedtime until return to office - this is the best way to tell whether stomach acid is contributing to your problem.    Please remember to go to the  x-ray department downstairs for your tests - we will call you with the results when they are available.     Please schedule a follow up office visit in 6 weeks, call sooner if needed - you will need to walk slower or consider starting 0xygen therapy

## 2013-06-18 NOTE — Progress Notes (Signed)
PFT done today. 

## 2013-06-18 NOTE — Progress Notes (Signed)
Subjective:    Patient ID: Alexa Lawson, female    DOB: 05/06/1934  MRN: [id number]    Brief patient profile:  45 yobf quit smoking 1998  with no resp problems until  1990's cough ? From ACEi then 2006 bad pneumonia in ICU Dr Owens Shark in [facility] 100% improved then  onset   cough and sob onset 2013 referred to pulmonary clinic 09/02/2012 by Dr Barkley Bruns with abn cxr periop for parathyroidectomy   09/02/2012 1st pulmonary eval cc indolent onset sob with dry hacking cough x 8-12 months   varied some with wt gain house work and gets sob if does more than slow pace at basline Has year round nose stuffy and runs year round clear x 15 y rec Wt loss, gerd diet   10/20/2012 f/u ov/[name] re ? ILD Chief Complaint  Patient presents with  . Follow-up    Breathing doing the same. No new co's today.   not really limited by breathing from doing desired activities. Main complaint is chronic dry hacking daytime cough rec Pantoprazole (protonix) 40 mg   Take 30-60 min before first meal of the day and Pepcid 20 mg one bedtime until return to office >  Did not follow instructions because "the meds ran out" - I thought you wanted me to stop them  GERD diet   01/20/2013 f/u ov/[name] re ? ILD  Chief Complaint  Patient presents with  . Follow-up    Pt states breathing is unchanged and denies any new co's. Cough has improved some.  not limited by breathing from desired activities including "chasing my grandchildren around" Cough is dry and mostly with talking some better No h/o macrodantin exp, no h/o connective tissue dz. rec Please see patient coordinator before you leave today to have your [place] doctor fax [place] all your cxr and CT reports Continue acid suppression and diet as before.  Please schedule a follow up visit in 3 months but call sooner if needed with pft's and cxr    06/18/2013 f/u ov/[name] re:  Chief Complaint  Patient presents with  . Followup with PFT's    Pt c/o DOE for  the past 2 to 3 months. Sats were 80%ra after walking to exam room and did not increase after rest and pursed lip breathing. Sats increased to 99% after pt placed on oxygen 2lpm continuous.    Still can do [employer] [facility] [facility]  Can't chase grand children  Urge to clear throat worse since stopped gerd rx, worse day than night   No obvious daytime variabilty or assoc purulent sputum or cp or chest tightness, subjective wheeze overt sinus or hb symptoms. No unusual exp hx or h/o childhood pna/ asthma or premature birth to her knowledge.  Sleeping ok without nocturnal  or early am exacerbation  of respiratory  c/o's or need for noct saba. Also denies any obvious fluctuation of symptoms with weather or environmental changes or other aggravating or alleviating factors except as outlined above    Current Medications, Allergies, Past Medical History, Past Surgical History, Family History, and Social History were reviewed in [employer] record.  ROS  The following are not active complaints unless bolded sore throat, dysphagia, dental problems, itching, sneezing,  nasal congestion or excess/ purulent secretions, ear ache,   fever, chills, sweats, unintended wt loss, pleuritic or exertional cp, hemoptysis,  orthopnea pnd or leg swelling, presyncope, palpitations, heartburn, abdominal pain, anorexia, nausea, vomiting, diarrhea  or change in bowel  or urinary habits, change in stools or urine, dysuria,hematuria,  rash, arthralgias, visual complaints, headache, numbness weakness or ataxia or problems with walking or coordination,  change in mood/affect or memory.            Objective:   Physical Exam  10/20/2012         218  > 01/20/2013   219  >   06/18/2013  211   Wt Readings from Last 3 Encounters:  09/02/12 221 lb 12.8 oz (100.608 kg)  08/27/12 221 lb 9.5 oz (100.513 kg)  08/27/12 221 lb 9.5 oz (100.513 kg)    HEENT: nl dentition, turbinates, and orophanx. Nl external ear canals  without cough reflex   NECK :  without JVD/Nodes/TM/ nl carotid upstrokes bilaterally   LUNGS: no acc muscle use, trace crackles R > L    CV:  RRR  no s3 or murmur or increase in P2, no edema   ABD:  soft and nontender with nl excursion in the supine position. No bruits or organomegaly, bowel sounds nl  MS:  warm without deformities, calf tenderness, cyanosis or clubbing         CXR   06/18/13  Chronic changes in the bases without acute abnormality.       Assessment & Plan:

## 2013-06-19 NOTE — Assessment & Plan Note (Addendum)
-  Onset of symptoms 2013 with no baseline cxr available - 09/02/2012  Walked RA x 3 laps @ 185 ft each stopped due to end of study, no desats - PFT's 10/20/2012 FEV1  VC 2.19 (88%) with no airflow obst and DLCO 45 corrects to 66% - PFT's 06/18/2013   VC  2.37 (100%) with no airflow obst and DLCO 46 corrects to 68%  Off gerd rx with desat and increased cough > resume gerd rx  There has been no progression at all in ILD x 6 m by pfts c/w prev assumption of PF p ALI, not UIP/IPF but now she desat's  ? Related to component of diastolic chf from poorly controlled bp.  Seems to struggle with concept of med rec so will bring her back in 1 weeks to recheck bp/bnp/esr and sats  See instructions for specific recommendations which were reviewed directly with the patient who was given a copy with highlighter outlining the key components.

## 2013-06-19 NOTE — Assessment & Plan Note (Signed)
Not Adequate control on present rx, reviewed > no change in rx needed  > recheck one week with all meds in hand

## 2013-06-21 ENCOUNTER — Telehealth: Payer: Self-pay | Admitting: *Deleted

## 2013-06-21 NOTE — Progress Notes (Signed)
Quick Note:  Spoke with pt and notified of results per Dr. Wert. Pt verbalized understanding and denied any questions.  ______ 

## 2013-06-21 NOTE — Telephone Encounter (Signed)
Message copied by Rosana Berger on Mon Jun 21, 2013 10:12 AM ------      Message from: Tanda Rockers      Created: Sat Jun 19, 2013  7:44 AM       I missed the bp so high - this may be why her bp is so high so needs to return to see Tammy Asap with all meds in hand to recheck bp and do labs (BNP, bmet, ESR, cbc with diff) ------

## 2013-06-21 NOTE — Telephone Encounter (Signed)
Spoke with the pt and scheduled ov with TP for wed 06/23/13 Pt aware to bring all meds

## 2013-06-23 ENCOUNTER — Ambulatory Visit (INDEPENDENT_AMBULATORY_CARE_PROVIDER_SITE_OTHER): Payer: Medicare Other | Admitting: Adult Health

## 2013-06-23 ENCOUNTER — Other Ambulatory Visit (INDEPENDENT_AMBULATORY_CARE_PROVIDER_SITE_OTHER): Payer: Medicare Other

## 2013-06-23 ENCOUNTER — Encounter: Payer: Self-pay | Admitting: Adult Health

## 2013-06-23 VITALS — BP 140/70 | HR 66 | Temp 97.3°F | Ht 66.0 in | Wt 212.0 lb

## 2013-06-23 DIAGNOSIS — I10 Essential (primary) hypertension: Secondary | ICD-10-CM

## 2013-06-23 DIAGNOSIS — R0600 Dyspnea, unspecified: Secondary | ICD-10-CM

## 2013-06-23 DIAGNOSIS — R0609 Other forms of dyspnea: Secondary | ICD-10-CM

## 2013-06-23 DIAGNOSIS — R0989 Other specified symptoms and signs involving the circulatory and respiratory systems: Secondary | ICD-10-CM

## 2013-06-23 LAB — CBC WITH DIFFERENTIAL/PLATELET
BASOS ABS: 0.1 10*3/uL (ref 0.0–0.1)
Basophils Relative: 0.8 % (ref 0.0–3.0)
Eosinophils Absolute: 0.3 10*3/uL (ref 0.0–0.7)
Eosinophils Relative: 4.4 % (ref 0.0–5.0)
HCT: 40.4 % (ref 36.0–46.0)
Hemoglobin: 12.7 g/dL (ref 12.0–15.0)
LYMPHS PCT: 26.6 % (ref 12.0–46.0)
Lymphs Abs: 1.9 10*3/uL (ref 0.7–4.0)
MCHC: 31.5 g/dL (ref 30.0–36.0)
MCV: 89.1 fl (ref 78.0–100.0)
MONOS PCT: 10.9 % (ref 3.0–12.0)
Monocytes Absolute: 0.8 10*3/uL (ref 0.1–1.0)
Neutro Abs: 4.1 10*3/uL (ref 1.4–7.7)
Neutrophils Relative %: 57.3 % (ref 43.0–77.0)
PLATELETS: 309 10*3/uL (ref 150.0–400.0)
RBC: 4.53 Mil/uL (ref 3.87–5.11)
RDW: 15.6 % — ABNORMAL HIGH (ref 11.5–14.6)
WBC: 7.1 10*3/uL (ref 4.5–10.5)

## 2013-06-23 LAB — BASIC METABOLIC PANEL
BUN: 14 mg/dL (ref 6–23)
CHLORIDE: 104 meq/L (ref 96–112)
CO2: 26 mEq/L (ref 19–32)
CREATININE: 1.3 mg/dL — AB (ref 0.4–1.2)
Calcium: 8.5 mg/dL (ref 8.4–10.5)
GFR: 52.44 mL/min — AB (ref >60.00)
Glucose, Bld: 80 mg/dL (ref 70–99)
Potassium: 4.3 mEq/L (ref 3.5–5.1)
Sodium: 137 mEq/L (ref 135–145)

## 2013-06-23 LAB — BRAIN NATRIURETIC PEPTIDE: Pro B Natriuretic peptide (BNP): 67 pg/mL (ref 0.0–100.0)

## 2013-06-23 LAB — SEDIMENTATION RATE: SED RATE: 43 mm/h — AB (ref 0–22)

## 2013-06-23 NOTE — Patient Instructions (Addendum)
Labs today  Your blood pressure has been elevated last few visits Need office visit with primary care doctor for elevated blood pressure as soon as possible  Follow up Dr. Melvyn Novas  In 6 weeks and As needed

## 2013-06-24 NOTE — Progress Notes (Deleted)
   Subjective:    Patient ID: Alexa Lawson, female    DOB: 23-Feb-1934, 80 y.o.   MRN: [id number]   HPI  06/23/2013 Follow-up   ***  ROS:     Objective:   Physical Exam BP 140/70  Pulse 66  Temp(Src) 97.3 F (36.3 C) (Oral)  Ht _0  (1.676 m)  Wt 212 lb (96.163 kg)  BMI 34.23 kg/m2  SpO2 93%         Assessment & Plan:  1. HTN (hypertension) *** - B Nat Peptide; Future - Basic Metabolic Panel (BMET); Future - Sed Rate (ESR); Future - CBC w/Diff; Future  2. Dyspnea *** - B Nat Peptide; Future  No Follow-up on file.

## 2013-06-25 NOTE — Progress Notes (Signed)
Subjective:    Patient ID: Alexa Lawson, female    DOB: December 16, 1933  MRN: [id number]    Brief patient profile:  51 yobf quit smoking 1998  with no resp problems until  1990's cough ? From ACEi then 2006 bad pneumonia in ICU Dr Owens Shark in [facility] 100% improved then  onset   cough and sob onset 2013 referred to pulmonary clinic 09/02/2012 by Dr Barkley Bruns with abn cxr periop for parathyroidectomy   09/02/2012 1st pulmonary eval cc indolent onset sob with dry hacking cough x 8-12 months   varied some with wt gain house work and gets sob if does more than slow pace at basline Has year round nose stuffy and runs year round clear x 15 y rec Wt loss, gerd diet   10/20/2012 f/u ov/Wert re ? ILD Chief Complaint  Patient presents with  . Follow-up    Breathing doing the same. No new co's today.   not really limited by breathing from doing desired activities. Main complaint is chronic dry hacking daytime cough rec Pantoprazole (protonix) 40 mg   Take 30-60 min before first meal of the day and Pepcid 20 mg one bedtime until return to office >  Did not follow instructions because "the meds ran out" - I thought you wanted me to stop them  GERD diet   01/20/2013 f/u ov/Wert re ? ILD  Chief Complaint  Patient presents with  . Follow-up    Pt states breathing is unchanged and denies any new co's. Cough has improved some.  not limited by breathing from desired activities including "chasing my grandchildren around" Cough is dry and mostly with talking some better No h/o macrodantin exp, no h/o connective tissue dz. rec Please see patient coordinator before you leave today to have your [place] doctor fax [place] all your cxr and CT reports Continue acid suppression and diet as before.  Please schedule a follow up visit in 3 months but call sooner if needed with pft's and cxr    06/18/2013 f/u ov/Wert re:  Chief Complaint  Patient presents with  . Followup with PFT's    Pt c/o DOE for  the past 2 to 3 months. Sats were 80%ra after walking to exam room and did not increase after rest and pursed lip breathing. Sats increased to 99% after pt placed on oxygen 2lpm continuous.    Still can do [employer] [facility]  Can't chase grand children  Urge to clear throat worse since stopped gerd rx, worse day than night   >>PPI/pepcid   06/23/13 Follow up  Pt returns for elevated b/p . Last visit was noticed to have elevated b/p. Returns today for labs.  Says she has not missed any of her b/p rx.  Denies any chest pain, orthopnea, PND, or leg swelling. Denies any nonsteroidal or decongestant use.    Current Medications, Allergies, Past Medical History, Past Surgical History, Family History, and Social History were reviewed in [employer] record.  ROS  The following are not active complaints unless bolded sore throat, dysphagia, dental problems, itching, sneezing,  nasal congestion or excess/ purulent secretions, ear ache,   fever, chills, sweats, unintended wt loss, pleuritic or exertional cp, hemoptysis,  orthopnea pnd or leg swelling, presyncope, palpitations, heartburn, abdominal pain, anorexia, nausea, vomiting, diarrhea  or change in bowel or urinary habits, change in stools or urine, dysuria,hematuria,  rash, arthralgias, visual complaints, headache, numbness weakness or ataxia or problems with walking or coordination,  change in mood/affect or memory.            Objective:   Physical Exam  10/20/2012         218  > 01/20/2013   219  >   06/18/2013  211  >212 2/4   HEENT: nl dentition, turbinates, and orophanx. Nl external ear canals without cough reflex   NECK :  without JVD/Nodes/TM/ nl carotid upstrokes bilaterally   LUNGS: no acc muscle use, diminished BS in bases    CV:  RRR  no s3 or murmur or increase in P2, no edema   ABD:  soft and nontender with nl excursion in the supine position. No bruits or organomegaly, bowel sounds nl  MS:  warm  without deformities, calf tenderness, cyanosis or clubbing         CXR   06/18/13  Chronic changes in the bases without acute abnormality.       Assessment & Plan:

## 2013-06-25 NOTE — Assessment & Plan Note (Signed)
B/p not under good control   Plan  Labs today  Your blood pressure has been elevated last few visits Need office visit with primary care doctor for elevated blood pressure as soon as possible  Follow up Dr. Melvyn Novas  In 6 weeks and As needed

## 2013-07-30 ENCOUNTER — Ambulatory Visit: Payer: Medicare Other | Admitting: Internal Medicine

## 2013-08-03 ENCOUNTER — Ambulatory Visit (INDEPENDENT_AMBULATORY_CARE_PROVIDER_SITE_OTHER): Payer: Medicare Other | Admitting: Internal Medicine

## 2013-08-03 ENCOUNTER — Encounter: Payer: Self-pay | Admitting: Internal Medicine

## 2013-08-03 ENCOUNTER — Other Ambulatory Visit (INDEPENDENT_AMBULATORY_CARE_PROVIDER_SITE_OTHER): Payer: Medicare Other

## 2013-08-03 ENCOUNTER — Ambulatory Visit (INDEPENDENT_AMBULATORY_CARE_PROVIDER_SITE_OTHER)
Admission: RE | Admit: 2013-08-03 | Discharge: 2013-08-03 | Disposition: A | Discharge status: Home / Self Care | Payer: Medicare Other | Source: Ambulatory Visit | Attending: Internal Medicine | Admitting: Internal Medicine

## 2013-08-03 VITALS — BP 142/82 | HR 87 | Temp 97.5°F | Ht 66.5 in | Wt 212.2 lb

## 2013-08-03 DIAGNOSIS — R0609 Other forms of dyspnea: Secondary | ICD-10-CM

## 2013-08-03 DIAGNOSIS — R0600 Dyspnea, unspecified: Secondary | ICD-10-CM

## 2013-08-03 DIAGNOSIS — J8410 Pulmonary fibrosis, unspecified: Secondary | ICD-10-CM

## 2013-08-03 DIAGNOSIS — R0989 Other specified symptoms and signs involving the circulatory and respiratory systems: Secondary | ICD-10-CM

## 2013-08-03 DIAGNOSIS — I10 Essential (primary) hypertension: Secondary | ICD-10-CM

## 2013-08-03 DIAGNOSIS — J9611 Chronic respiratory failure with hypoxia: Secondary | ICD-10-CM | POA: Insufficient documentation

## 2013-08-03 DIAGNOSIS — IMO0001 Reserved for inherently not codable concepts without codable children: Secondary | ICD-10-CM

## 2013-08-03 LAB — CBC WITH DIFFERENTIAL/PLATELET
Basophils Absolute: 0 10*3/uL (ref 0.0–0.1)
Basophils Relative: 0.6 % (ref 0.0–3.0)
EOS PCT: 3.9 % (ref 0.0–5.0)
Eosinophils Absolute: 0.2 10*3/uL (ref 0.0–0.7)
HCT: 38.1 % (ref 36.0–46.0)
Hemoglobin: 12.4 g/dL (ref 12.0–15.0)
Lymphocytes Relative: 27.4 % (ref 12.0–46.0)
Lymphs Abs: 1.6 10*3/uL (ref 0.7–4.0)
MCHC: 32.5 g/dL (ref 30.0–36.0)
MCV: 86.6 fl (ref 78.0–100.0)
Monocytes Absolute: 0.7 10*3/uL (ref 0.1–1.0)
Monocytes Relative: 12 % (ref 3.0–12.0)
NEUTROS PCT: 56.1 % (ref 43.0–77.0)
Neutro Abs: 3.3 10*3/uL (ref 1.4–7.7)
Platelets: 293 10*3/uL (ref 150.0–400.0)
RBC: 4.4 Mil/uL (ref 3.87–5.11)
RDW: 16.1 % — ABNORMAL HIGH (ref 11.5–14.6)
WBC: 5.9 10*3/uL (ref 4.5–10.5)

## 2013-08-03 LAB — BASIC METABOLIC PANEL
BUN: 21 mg/dL (ref 6–23)
CHLORIDE: 106 meq/L (ref 96–112)
CO2: 24 mEq/L (ref 19–32)
Calcium: 8.6 mg/dL (ref 8.4–10.5)
Creatinine, Ser: 1.7 mg/dL — ABNORMAL HIGH (ref 0.4–1.2)
GFR: 38.76 mL/min — AB (ref >60.00)
Glucose, Bld: 92 mg/dL (ref 70–99)
Potassium: 4.4 mEq/L (ref 3.5–5.1)
Sodium: 139 mEq/L (ref 135–145)

## 2013-08-03 LAB — BRAIN NATRIURETIC PEPTIDE: Pro B Natriuretic peptide (BNP): 265 pg/mL — ABNORMAL HIGH (ref 0.0–100.0)

## 2013-08-03 LAB — SEDIMENTATION RATE: SED RATE: 28 mm/h — AB (ref 0–22)

## 2013-08-03 NOTE — Assessment & Plan Note (Addendum)
-  Onset of symptoms 2013 with no baseline cxr available - 09/02/2012  Walked RA x 3 laps @ 185 ft each stopped due to end of study, no desats - PFT's 10/20/2012 FEV1  VC 2.19 (88%) with no airflow obst and DLCO 45 corrects to 66% - PFT's 06/18/2013   VC  2.37 (100%) with no airflow obst and DLCO 46 corrects to 68%  Off gerd rx with desat and increased cough > resume gerd rx - 08/03/2013   Walked RA x one lap @ 185 stopped due to  Sob/ sat 88%   It appears that she actually may have some other form of PF than post ALI PF > she does qualify for 02 but did not really feel it helped her nor that she would use it so hold off for now and rx symptomatically by trying to correct her PNDS and get her back for full medication reconciliation   To keep things simple, I have asked the patient to first separate medicines that are perceived as maintenance, that is to be taken daily "no matter what", from those medicines that are taken on only on an as-needed basis and I have given the patient examples of both, and then return to see our NP to generate a  detailed  medication calendar which should be followed until the next physician sees the patient and updates it.

## 2013-08-03 NOTE — Progress Notes (Signed)
Subjective:    Patient ID: Alexa Lawson, female    DOB: 12-14-1933  MRN: [id number]    Brief patient profile:  19 yobf quit smoking 1998  with no resp problems until  1990's cough ? From ACEi then 2006 bad pneumonia in ICU Dr Owens Shark in [facility] 100% improved then  onset   cough and sob onset 2013 referred to pulmonary clinic 09/02/2012 by Dr Barkley Bruns with abn cxr periop for parathyroidectomy    History of Present Illness  09/02/2012 1st pulmonary eval cc indolent onset sob with dry hacking cough x 8-12 months   varied some with wt gain house work and gets sob if does more than slow pace at basline Has year round nose stuffy and runs year round clear x 15 y rec Wt loss, gerd diet   10/20/2012 f/u ov/Alexa Lawson re ? ILD Chief Complaint  Patient presents with  . Follow-up    Breathing doing the same. No new co's today.   not really limited by breathing from doing desired activities. Main complaint is chronic dry hacking daytime cough rec Pantoprazole (protonix) 40 mg   Take 30-60 min before first meal of the day and Pepcid 20 mg one bedtime until return to office >  Did not follow instructions because "the meds ran out" - I thought you wanted me to stop them  GERD diet   01/20/2013 f/u ov/Alexa Lawson re ? ILD  Chief Complaint  Patient presents with  . Follow-up    Pt states breathing is unchanged and denies any new co's. Cough has improved some.  not limited by breathing from desired activities including "chasing my grandchildren around" Cough is dry and mostly with talking some better No h/o macrodantin exp, no h/o connective tissue dz. rec Please see patient coordinator before you leave today to have your [place] doctor fax [place] all your cxr and CT reports Continue acid suppression and diet as before.  Please schedule a follow up visit in 3 months but call sooner if needed with pft's and cxr    06/18/2013 f/u ov/Alexa Lawson re:  Chief Complaint  Patient presents with  . Followup  with PFT's    Pt c/o DOE for the past 2 to 3 months. Sats were 80%ra after walking to exam room and did not increase after rest and pursed lip breathing. Sats increased to 99% after pt placed on oxygen 2lpm continuous.    Still can do [employer] [facility]  Can't chase grand children  Urge to clear throat worse since stopped gerd rx, worse day than night   >>PPI/pepcid       08/03/2013 f/u ov/Alexa Lawson re: PF/ ex hypoxemia Chief Complaint  Patient presents with  . Follow-up    Pt states DOE progressively worse since the last visit.   becoming less active, doesn't actually have any grandchildren to chase around per daughter who thinks her mother is confused.  Having lots of issues with runny nose, need to clear throat day and at hs with cough then   .No obvious day to day or daytime variabilty or assoc cp or chest tightness, subjective wheeze overt sinus or hb symptoms. No unusual exp hx or h/o childhood pna/ asthma or knowledge of premature birth.  Sleeping ok without nocturnal  or early am exacerbation  of respiratory  c/o's or need for noct saba. Also denies any obvious fluctuation of symptoms with weather or environmental changes or other aggravating or alleviating factors except as outlined above  Current Medications, Allergies, Complete Past Medical History, Past Surgical History, Family History, and Social History were reviewed in [employer] record.  ROS  The following are not active complaints unless bolded sore throat, dysphagia, dental problems, itching, sneezing,  nasal congestion , ear ache,   fever, chills, sweats, unintended wt loss, pleuritic or exertional cp, hemoptysis,  orthopnea pnd or leg swelling, presyncope, palpitations, heartburn, abdominal pain, anorexia, nausea, vomiting, diarrhea  or change in bowel or urinary habits, change in stools or urine, dysuria,hematuria,  rash, arthralgias, visual complaints, headache, numbness weakness or ataxia or  problems with walking or coordination,  change in mood/affect or memory.                     Objective:   Physical Exam  10/20/2012         218  > 01/20/2013   219  >   06/18/2013  211  >212 08/03/2013    HEENT: nl dentition, turbinates, and orophanx. Nl external ear canals without cough reflex   NECK :  without JVD/Nodes/TM/ nl carotid upstrokes bilaterally   LUNGS: no acc muscle use, diminished BS in bases    CV:  RRR  no s3 or murmur or increase in P2, no edema   ABD:  soft and nontender with nl excursion in the supine position. No bruits or organomegaly, bowel sounds nl  MS:  warm without deformities, calf tenderness, cyanosis or clubbing         CXR  08/03/2013 :  Pulmonary fibrosis. No active process        Assessment & Plan:

## 2013-08-03 NOTE — Assessment & Plan Note (Signed)
-  08/03/2013   Walked RA x one lap @ 185 stopped due to  Sob/ sat 88% > improved on 2lpm and completed 3 laps  Declined 02 at this point

## 2013-08-03 NOTE — Assessment & Plan Note (Signed)
Ok off exforge so far > defer rx to Dr Carlis Abbott and note creat rising sltly over baseline off arb  Lab Results  Component Value Date   CREATININE 1.7* 08/03/2013   CREATININE 1.3* 06/23/2013   CREATININE 1.15* 08/28/2012

## 2013-08-03 NOTE — Progress Notes (Signed)
Quick Note:  Spoke with pt and notified of results per Dr. Melvyn Novas. Pt verbalized understanding and denied any questions.  ______

## 2013-08-03 NOTE — Progress Notes (Signed)
Quick Note:  Spoke with pt and notified of results per Dr. Wert. Pt verbalized understanding and denied any questions.  ______ 

## 2013-08-03 NOTE — Patient Instructions (Addendum)
For drainage take chlortrimeton (chlorpheniramine) 4 mg every 4 hours available over the counter (may cause drowsiness)   Ok for handicap parking.  GERD (REFLUX)  is an extremely common cause of respiratory symptoms, many times with no significant heartburn at all.    It can be treated with medication, but also with lifestyle changes including avoidance of late meals, excessive alcohol, smoking cessation, and avoid fatty foods, chocolate, peppermint, colas, red wine, and acidic juices such as orange juice.  NO MINT OR MENTHOL PRODUCTS SO NO COUGH DROPS  USE SUGARLESS CANDY INSTEAD (jolley ranchers or Stover's)  NO OIL BASED VITAMINS - use powdered substitutes.   Please remember to go to the lab and x-ray department downstairs for your tests - we will call you with the results when they are available.  See Tammy NP w/in 2 weeks with all your medications, even over the counter meds, separated in two separate bags, the ones you take no matter what vs the ones you stop once you feel better and take only as needed when you feel you need them.   Tammy  will generate for you a new user friendly medication calendar that will put [place] all on the same page re: your medication use.     Without this process, it simply isn't possible to assure that we are providing  your outpatient care  with  the attention to detail we feel you deserve.   If we cannot assure that you're getting that kind of care,  then we cannot manage your problem effectively from this clinic.  Once you have seen Tammy and we are sure that we're all on the same page with your medication use she will arrange follow up with me .

## 2013-08-17 ENCOUNTER — Encounter: Payer: Self-pay | Admitting: Adult Health

## 2013-08-17 ENCOUNTER — Ambulatory Visit (INDEPENDENT_AMBULATORY_CARE_PROVIDER_SITE_OTHER): Payer: Medicare Other | Admitting: Adult Health

## 2013-08-17 VITALS — BP 128/70 | HR 71 | Temp 97.1°F | Ht 67.0 in | Wt 213.8 lb

## 2013-08-17 DIAGNOSIS — G4710 Hypersomnia, unspecified: Secondary | ICD-10-CM

## 2013-08-17 DIAGNOSIS — G4719 Other hypersomnia: Secondary | ICD-10-CM

## 2013-08-17 DIAGNOSIS — IMO0001 Reserved for inherently not codable concepts without codable children: Secondary | ICD-10-CM

## 2013-08-17 DIAGNOSIS — I10 Essential (primary) hypertension: Secondary | ICD-10-CM

## 2013-08-17 DIAGNOSIS — J8410 Pulmonary fibrosis, unspecified: Secondary | ICD-10-CM

## 2013-08-17 NOTE — Patient Instructions (Signed)
Sleep Study will  Be set up .  I will call regarding CT chest  Refer for Internal medicine establishment  Follow med calendar closely and bring to each visit.  Follow up Dr. Melvyn Novas  In 6 weeks and As needed

## 2013-08-17 NOTE — Assessment & Plan Note (Signed)
Symptoms of OSA   Plan  Set up sleep study

## 2013-08-17 NOTE — Assessment & Plan Note (Addendum)
Pulmonary fibrosis, of unknown etiology. Discussed with patient regarding initiation of oxygen as she hasa much reduced. Saturations patient wants to hold off for now. However, daughter says that she will begin if she continues to desaturate. We'll look into see if patient has had recent CT scan. If not will need to have a CT chest. We'll continue to treat for triggers of her cough with reflux, and drainage. Patient's medications were reviewed today and patient education was given. Computerized medication calendar was adjusted/completed

## 2013-08-17 NOTE — Progress Notes (Signed)
Subjective:    Patient ID: Alexa Lawson, female    DOB: 1934/01/02  MRN: [id number]    Brief patient profile:  65 yobf quit smoking 1998  with no resp problems until  1990's cough ? From ACEi then 2006 bad pneumonia in ICU Dr Owens Shark in [facility] 100% improved then  onset   cough and sob onset 2013 referred to pulmonary clinic 09/02/2012 by Dr Barkley Bruns with abn cxr periop for parathyroidectomy    History of Present Illness  09/02/2012 1st pulmonary eval cc indolent onset sob with dry hacking cough x 8-12 months   varied some with wt gain house work and gets sob if does more than slow pace at basline Has year round nose stuffy and runs year round clear x 15 y rec Wt loss, gerd diet   10/20/2012 f/u ov/Wert re ? ILD Chief Complaint  Patient presents with  . Follow-up    Breathing doing the same. No new co's today.   not really limited by breathing from doing desired activities. Main complaint is chronic dry hacking daytime cough rec Pantoprazole (protonix) 40 mg   Take 30-60 min before first meal of the day and Pepcid 20 mg one bedtime until return to office >  Did not follow instructions because "the meds ran out" - I thought you wanted me to stop them  GERD diet   01/20/2013 f/u ov/Wert re ? ILD  Chief Complaint  Patient presents with  . Follow-up    Pt states breathing is unchanged and denies any new co's. Cough has improved some.  not limited by breathing from desired activities including "chasing my grandchildren around" Cough is dry and mostly with talking some better No h/o macrodantin exp, no h/o connective tissue dz. rec Please see patient coordinator before you leave today to have your [place] doctor fax [place] all your cxr and CT reports Continue acid suppression and diet as before.  Please schedule a follow up visit in 3 months but call sooner if needed with pft's and cxr    06/18/2013 f/u ov/Wert re:  Chief Complaint  Patient presents with  . Followup  with PFT's    Pt c/o DOE for the past 2 to 3 months. Sats were 80%ra after walking to exam room and did not increase after rest and pursed lip breathing. Sats increased to 99% after pt placed on oxygen 2lpm continuous.    Still can do [employer] [facility]  Can't chase grand children  Urge to clear throat worse since stopped gerd rx, worse day than night   >>PPI/pepcid       08/03/2013 f/u ov/Wert re: PF/ ex hypoxemia Chief Complaint  Patient presents with  . Follow-up    Pt states DOE progressively worse since the last visit.   becoming less active, doesn't actually have any grandchildren to chase around per daughter who thinks her mother is confused.  Having lots of issues with runny nose, need to clear throat day and at hs with cough then  >>chlortrimeton added   08/17/2013 Follow up and Med review  Patient returns for a two-week followup and medication review. We reviewed all her medications and organized them into a medication calendar with patient education. Appears the patient is taking her medications correctly. Daughter helps with her medications as daughter is a Marine scientist. Lab work. Last visit showed a sedimentation rate at 28, BNP at 265. , unrevealing CBC.  Daughter does voice some concerns over patient having underlying sleep  apnea.  Complains that patient has Snoring and wakes frequently at night . Tired during daytime.  We discussed a sleep study and they're in agreement to  Patient does have notable desaturations with ambulation.  She does have normal oxygen saturations at rest. We discussed the initiation of oxygen. However, patient is still hesitant at this time. Would like to think about this more.   Patient denies any fever, chest pain, orthopnea, PND, or increased leg swelling. They do request a referral for establishment with a primary care physician  Current Medications, Allergies, Complete Past Medical History, Past Surgical History, Family History, and Social  History were reviewed in [employer] record.  ROS  The following are not active complaints unless bolded sore throat, dysphagia, dental problems, itching, sneezing,  nasal congestion , ear ache,   fever, chills, sweats, unintended wt loss, pleuritic or exertional cp, hemoptysis,  orthopnea pnd or leg swelling, presyncope, palpitations, heartburn, abdominal pain, anorexia, nausea, vomiting, diarrhea  or change in bowel or urinary habits, change in stools or urine, dysuria,hematuria,  rash, arthralgias, visual complaints, headache, numbness weakness or ataxia or problems with walking or coordination,  change in mood/affect or memory.                     Objective:   Physical Exam  10/20/2012         218  > 01/20/2013   219  >   06/18/2013  211  >212 08/03/2013 >213 08/17/2013    HEENT: nl dentition, turbinates, and orophanx. Nl external ear canals without cough reflex   NECK :  without JVD/Nodes/TM/ nl carotid upstrokes bilaterally   LUNGS: no acc muscle use, diminished BS in bases    CV:  RRR  no s3 or murmur or increase in P2, no edema   ABD:  soft and nontender with nl excursion in the supine position. No bruits or organomegaly, bowel sounds nl  MS:  warm without deformities, calf tenderness, cyanosis or clubbing        CXR  08/03/2013 :  Pulmonary fibrosis. No active process        Assessment & Plan:

## 2013-08-19 ENCOUNTER — Other Ambulatory Visit: Payer: Self-pay | Admitting: Adult Health

## 2013-08-19 ENCOUNTER — Telehealth: Payer: Self-pay | Admitting: Adult Health

## 2013-08-19 DIAGNOSIS — J849 Interstitial pulmonary disease, unspecified: Secondary | ICD-10-CM

## 2013-08-19 DIAGNOSIS — IMO0001 Reserved for inherently not codable concepts without codable children: Secondary | ICD-10-CM

## 2013-08-19 NOTE — Telephone Encounter (Signed)
conversation with Dr. Melvyn Novas  , pt needs CT  Tried to call pt and daughter , left message to call back  Make sure follow up ov with Dr. Melvyn Novas  To discuss results.

## 2013-08-19 NOTE — Telephone Encounter (Signed)
Need to set up for a HRCT (High Resolution CT )  Re: ILD

## 2013-08-25 ENCOUNTER — Encounter: Payer: Self-pay | Admitting: Adult Health

## 2013-08-25 ENCOUNTER — Ambulatory Visit (HOSPITAL_COMMUNITY)
Admission: RE | Admit: 2013-08-25 | Discharge: 2013-08-25 | Disposition: A | Discharge status: Home / Self Care | Payer: Medicare Other | Source: Ambulatory Visit | Attending: Adult Health | Admitting: Adult Health

## 2013-08-25 ENCOUNTER — Encounter (HOSPITAL_COMMUNITY): Payer: Self-pay

## 2013-08-25 DIAGNOSIS — J8410 Pulmonary fibrosis, unspecified: Secondary | ICD-10-CM | POA: Insufficient documentation

## 2013-08-25 DIAGNOSIS — J984 Other disorders of lung: Secondary | ICD-10-CM | POA: Insufficient documentation

## 2013-08-25 DIAGNOSIS — R05 Cough: Secondary | ICD-10-CM | POA: Insufficient documentation

## 2013-08-25 DIAGNOSIS — J479 Bronchiectasis, uncomplicated: Secondary | ICD-10-CM | POA: Insufficient documentation

## 2013-08-25 DIAGNOSIS — K8020 Calculus of gallbladder without cholecystitis without obstruction: Secondary | ICD-10-CM | POA: Insufficient documentation

## 2013-08-25 DIAGNOSIS — J849 Interstitial pulmonary disease, unspecified: Secondary | ICD-10-CM

## 2013-08-25 DIAGNOSIS — I2510 Atherosclerotic heart disease of native coronary artery without angina pectoris: Secondary | ICD-10-CM | POA: Insufficient documentation

## 2013-08-25 DIAGNOSIS — R0602 Shortness of breath: Secondary | ICD-10-CM | POA: Insufficient documentation

## 2013-08-25 DIAGNOSIS — K7689 Other specified diseases of liver: Secondary | ICD-10-CM | POA: Insufficient documentation

## 2013-08-25 DIAGNOSIS — I7789 Other specified disorders of arteries and arterioles: Secondary | ICD-10-CM | POA: Insufficient documentation

## 2013-08-25 DIAGNOSIS — R059 Cough, unspecified: Secondary | ICD-10-CM | POA: Insufficient documentation

## 2013-08-31 ENCOUNTER — Telehealth: Payer: Self-pay | Admitting: Adult Health

## 2013-08-31 NOTE — Telephone Encounter (Signed)
Alexa Lawson is aware of CT results. Nothing further was needed.

## 2013-09-01 NOTE — Progress Notes (Signed)
Quick Note:  Daughter Lattie Haw aware per 4.14.15 phone note. ______

## 2013-09-17 LAB — HM DIABETES EYE EXAM

## 2013-09-22 ENCOUNTER — Ambulatory Visit (HOSPITAL_BASED_OUTPATIENT_CLINIC_OR_DEPARTMENT_OTHER): Payer: Medicare Other | Attending: Adult Health | Admitting: Radiology

## 2013-09-22 VITALS — Ht 67.0 in | Wt 202.0 lb

## 2013-09-22 DIAGNOSIS — G4733 Obstructive sleep apnea (adult) (pediatric): Secondary | ICD-10-CM | POA: Insufficient documentation

## 2013-09-22 DIAGNOSIS — IMO0001 Reserved for inherently not codable concepts without codable children: Secondary | ICD-10-CM

## 2013-09-24 DIAGNOSIS — G4730 Sleep apnea, unspecified: Secondary | ICD-10-CM

## 2013-09-24 DIAGNOSIS — G4710 Hypersomnia, unspecified: Secondary | ICD-10-CM

## 2013-09-24 NOTE — Sleep Study (Signed)
   NAME: Alexa Lawson DATE OF BIRTH:  27-Sep-1933 MEDICAL RECORD NUMBER [id number]  LOCATION: [facility]  PHYSICIAN: Armando Reichert Clance  DATE OF STUDY: 09/22/2013  SLEEP STUDY TYPE: Nocturnal Polysomnogram               REFERRING PHYSICIAN: Parrett, Tammy S, NP  INDICATION FOR STUDY: Hypersomnia with sleep apnea  EPWORTH SLEEPINESS SCORE:  18 HEIGHT: _0  (170.2 cm)  WEIGHT: 202 lb (91.627 kg)    Body mass index is 31.63 kg/(m^2).  NECK SIZE: 13.5 in.  MEDICATIONS: Reviewed in the sleep record  SLEEP ARCHITECTURE: The patient had a total sleep time of 333 minutes with no slow-wave sleep and only 26 minutes of REM. Sleep onset latency was normal at 16 minutes and REM onset was normal at 97 minutes. Sleep efficiency was mildly reduced at 87%.  RESPIRATORY DATA: The patient was found to have 19 central and obstructive apneas, and 25 obstructive hypopneas, giving her an AHI of 8 events per hour. The events occurred all in the supine position, and there was moderate to loud snoring noted throughout.  OXYGEN DATA: There was oxygen desaturation as low as 79% with the patient's obstructive events, and she also had desaturation independent of the events. She was found to have 38 minutes less than 88% during the night.  CARDIAC DATA: Occasional PAC noted  MOVEMENT/PARASOMNIA: No significant limb movements or abnormal behaviors were seen.  IMPRESSION/ RECOMMENDATION:    1)  very mild obstructive sleep apnea/hypopnea syndrome, with an AHI of 8 events per hour and oxygen desaturation transiently as low as 79%. Treatment for this degree of sleep apnea can include a trial of weight loss alone, upper airway surgery, dental appliance, and also CPAP. Clinical correlation is suggested.  2) oxygen desaturation also occurred independent of the patient's obstructive events, and she spent approximately 38 minutes the entire night less than 88%.   3) occasional PAC noted, but no  clinically significant arrhythmias were seen    Keith M Clance Diplomate, American Board of Sleep Medicine  ELECTRONICALLY SIGNED ON:  09/24/2013, 6:00 PM [place] PH: [phone]   FX: [phone] [place]

## 2013-09-28 ENCOUNTER — Ambulatory Visit (INDEPENDENT_AMBULATORY_CARE_PROVIDER_SITE_OTHER): Payer: Medicare Other

## 2013-09-28 ENCOUNTER — Ambulatory Visit (INDEPENDENT_AMBULATORY_CARE_PROVIDER_SITE_OTHER): Payer: Medicare Other | Admitting: Internal Medicine

## 2013-09-28 ENCOUNTER — Encounter: Payer: Self-pay | Admitting: Internal Medicine

## 2013-09-28 VITALS — BP 142/92 | HR 81 | Temp 97.6°F | Ht 67.0 in | Wt 208.0 lb

## 2013-09-28 DIAGNOSIS — IMO0001 Reserved for inherently not codable concepts without codable children: Secondary | ICD-10-CM

## 2013-09-28 DIAGNOSIS — G4710 Hypersomnia, unspecified: Secondary | ICD-10-CM

## 2013-09-28 DIAGNOSIS — J8410 Pulmonary fibrosis, unspecified: Secondary | ICD-10-CM

## 2013-09-28 DIAGNOSIS — R0902 Hypoxemia: Secondary | ICD-10-CM

## 2013-09-28 DIAGNOSIS — G4719 Other hypersomnia: Secondary | ICD-10-CM

## 2013-09-28 LAB — CBC WITH DIFFERENTIAL/PLATELET
BASOS ABS: 0 10*3/uL (ref 0.0–0.1)
Basophils Relative: 0.3 % (ref 0.0–3.0)
EOS ABS: 0.3 10*3/uL (ref 0.0–0.7)
Eosinophils Relative: 3.8 % (ref 0.0–5.0)
HEMATOCRIT: 39.3 % (ref 36.0–46.0)
Hemoglobin: 12.6 g/dL (ref 12.0–15.0)
LYMPHS ABS: 1.5 10*3/uL (ref 0.7–4.0)
LYMPHS PCT: 18.6 % (ref 12.0–46.0)
MCHC: 32.1 g/dL (ref 30.0–36.0)
MCV: 88.9 fl (ref 78.0–100.0)
MONOS PCT: 8.5 % (ref 3.0–12.0)
Monocytes Absolute: 0.7 10*3/uL (ref 0.1–1.0)
Neutro Abs: 5.5 10*3/uL (ref 1.4–7.7)
Neutrophils Relative %: 68.8 % (ref 43.0–77.0)
PLATELETS: 280 10*3/uL (ref 150.0–400.0)
RBC: 4.42 Mil/uL (ref 3.87–5.11)
RDW: 16.2 % — AB (ref 11.5–15.5)
WBC: 8 10*3/uL (ref 4.0–10.5)

## 2013-09-28 LAB — SEDIMENTATION RATE: SED RATE: 26 mm/h — AB (ref 0–22)

## 2013-09-28 LAB — RHEUMATOID FACTOR

## 2013-09-28 MED ORDER — FAMOTIDINE 20 MG PO TABS: ORAL_TABLET | ORAL | Status: DC

## 2013-09-28 MED ORDER — PANTOPRAZOLE SODIUM 40 MG PO TBEC: 40.0000 mg | DELAYED_RELEASE_TABLET | Freq: Every day | ORAL | Status: DC

## 2013-09-28 NOTE — Progress Notes (Signed)
Subjective:    Patient ID: Alexa Lawson, female    DOB: [birth year]  MRN: [id number]    Brief patient profile:  28 yobf quit smoking 1998  with no resp problems until  1990's cough ? From ACEi then 2006 bad pneumonia in ICU Dr Owens Shark in [facility] 100% improved then  onset   cough and sob onset 2013 referred to pulmonary clinic 09/02/2012 by Dr Barkley Bruns with abn cxr periop for parathyroidectomy    History of Present Illness  09/02/2012 1st pulmonary eval cc indolent onset sob with dry hacking cough x 8-12 months   varied some with wt gain house work and gets sob if does more than slow pace at basline Has year round nose stuffy and runs year round clear x 15 y rec Wt loss, gerd diet   10/20/2012 f/u ov/Dyasia Firestine re ? ILD Chief Complaint  Patient presents with  . Follow-up    Breathing doing the same. No new co's today.   not really limited by breathing from doing desired activities. Main complaint is chronic dry hacking daytime cough rec Pantoprazole (protonix) 40 mg   Take 30-60 min before first meal of the day and Pepcid 20 mg one bedtime until return to office >  Did not follow instructions because "the meds ran out" - I thought you wanted me to stop them  GERD diet   01/20/2013 f/u ov/Smera Guyette re ? ILD  Chief Complaint  Patient presents with  . Follow-up    Pt states breathing is unchanged and denies any new co's. Cough has improved some.  not limited by breathing from desired activities including "chasing my grandchildren around" Cough is dry and mostly with talking some better No h/o macrodantin exp, no h/o connective tissue dz. rec Please see patient coordinator before you leave today to have your [place] doctor fax [place] all your cxr and CT reports Continue acid suppression and diet as before.  Please schedule a follow up visit in 3 months but call sooner if needed with pft's and cxr    06/18/2013 f/u ov/Edye Hainline re:  Chief Complaint  Patient presents with  . Followup  with PFT's    Pt c/o DOE for the past 2 to 3 months. Sats were 80%ra after walking to exam room and did not increase after rest and pursed lip breathing. Sats increased to 99% after pt placed on oxygen 2lpm continuous.    Still can do [employer]  Can't chase grand children  Urge to clear throat worse since stopped gerd rx, worse day than night   >>PPI/pepcid       08/03/2013 f/u ov/Caden Fatica re: PF/ ex hypoxemia Chief Complaint  Patient presents with  . Follow-up    Pt states DOE progressively worse since the last visit.   becoming less active, doesn't actually have any grandchildren to chase around per daughter who thinks her mother is confused.  Having lots of issues with runny nose, need to clear throat day and at hs with cough then  >>chlortrimeton added   08/17/2013 Follow up and Med review  Patient returns for a two-week followup and medication review. We reviewed all her medications and organized them into a medication calendar with patient education. Appears the patient is taking her medications correctly. Daughter helps with her medications as daughter is a Marine scientist. Lab work. Last visit showed a sedimentation rate at 28, BNP at 265. , unrevealing CBC. Daughter does voice some concerns over patient having underlying sleep apnea.  Complains that patient has Snoring and wakes frequently at night . Tired during daytime.  We discussed a sleep study and they're in agreement to Patient does have notable desaturations with ambulation.  She does have normal oxygen saturations at rest. We discussed the initiation of oxygen. However, patient is still hesitant at this time. Would like to think about this more.  rec Sleep Study will  Be set up .  I will call regarding CT chest  Refer for Internal medicine establishment  Follow med calendar closely and bring to each visit.    09/28/2013 f/u ov/Chenika Nevils re: PF Chief Complaint  Patient presents with  . Follow-up    Pt reports no  improvement of Dyspnea since last OV. Pt states that with any exertion she becomes SOB.   some cough at hs, dry  No obvious day to day or daytime variabilty or assoc chronic cp or chest tightness, subjective wheeze overt sinus or hb symptoms. No unusual exp hx or h/o childhood pna/ asthma or knowledge of premature birth.  Sleeping ok without nocturnal  or early am exacerbation  of respiratory  c/o's or need for noct saba. Also denies any obvious fluctuation of symptoms with weather or environmental changes or other aggravating or alleviating factors except as outlined above   Current Medications, Allergies, Complete Past Medical History, Past Surgical History, Family History, and Social History were reviewed in [employer] record.  ROS  The following are not active complaints unless bolded sore throat, dysphagia, dental problems, itching, sneezing,  nasal congestion or excess/ purulent secretions, ear ache,   fever, chills, sweats, unintended wt loss, pleuritic or exertional cp, hemoptysis,  orthopnea pnd or leg swelling, presyncope, palpitations, heartburn, abdominal pain, anorexia, nausea, vomiting, diarrhea  or change in bowel or urinary habits, change in stools or urine, dysuria,hematuria,  rash, arthralgias, visual complaints, headache, numbness weakness or ataxia or problems with walking or coordination,  change in mood/affect or memory.          Objective:   Physical Exam  10/20/2012         218  > 01/20/2013   219  >   06/18/2013  211  >212 08/03/2013 >213 08/17/2013 > 09/28/2013 209   HEENT: nl dentition, turbinates, and orophanx. Nl external ear canals without cough reflex   NECK :  without JVD/Nodes/TM/ nl carotid upstrokes bilaterally   LUNGS: no acc muscle use, diminished BS in bases    CV:  RRR  no s3 or murmur or increase in P2, no edema   ABD:  soft and nontender with nl excursion in the supine position. 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 5 mm flat nodular density along the right major fissure, likely a subpleural lymph node    Lab Results  Component Value Date   ESRSEDRATE 26* 09/28/2013   ESRSEDRATE 28* 08/03/2013   ESRSEDRATE 43* 06/23/2013     Rheum profile neg ex ANA nucleolar at 1:640      Assessment & Plan:

## 2013-09-28 NOTE — Patient Instructions (Addendum)
Please see patient coordinator before you leave today  to schedule portable 02 2lpm with activity  Try taking the chlortrimeton 2 at bedtime  Please remember to go to the lab  department downstairs for your tests - we will call you with the results when they are available.  Please schedule a follow up office visit in 4 weeks, sooner if needed

## 2013-09-29 LAB — ANTI-NUCLEAR AB-TITER (ANA TITER): ANA Titer 1: 1:640 {titer} — ABNORMAL HIGH

## 2013-09-29 LAB — CYCLIC CITRUL PEPTIDE ANTIBODY, IGG: Cyclic Citrullin Peptide Ab: <2 U/mL (ref 0.0–5.0)

## 2013-09-29 LAB — ANA: ANA: POSITIVE — AB

## 2013-09-30 NOTE — Assessment & Plan Note (Signed)
Sleep study 09/22/13 >>>  Results still pending, will arrange for noct trial of cpap if need and f/u with sleep doc

## 2013-09-30 NOTE — Assessment & Plan Note (Signed)
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 5 mm flat nodular density along the right major fissure, likely a subpleural lymph node - 09/28/13 rheum profile neg ex ana Pos nucleolar 1:640   Will discuss with Colleagues but not much to offer here except OFEV which can't be expected to improve her symptoms but may keep them from getting any worse.    In meantime needs to continue to observe rx with acid suppression and gerd diet, reviewed

## 2013-09-30 NOTE — Assessment & Plan Note (Signed)
-  08/03/2013   Walked RA x one lap @ 185 stopped due to  Sob/ sat 88% > improved on 2lpm and completed 3 laps -   09/28/13 Patient Saturations on Room Air at Rest = 99% Patient Saturations on Room Air while Ambulating = 88% Patient Saturations on 2Liters of oxygen while Ambulating = 92%> rx 2lpm with ambulation only

## 2013-10-01 NOTE — Progress Notes (Signed)
Quick Note:  Spoke with pt and notified of results per Dr. Wert. Pt verbalized understanding and denied any questions.  ______ 

## 2013-10-15 ENCOUNTER — Telehealth: Payer: Self-pay | Admitting: Internal Medicine

## 2013-10-15 NOTE — Telephone Encounter (Signed)
If having increased dyspnea will need ov with all meds  LMTCB for Alexa Lawson

## 2013-10-18 NOTE — Telephone Encounter (Signed)
I spoke with the pt daughter and she states the pt is using her oxygen 24/7 now. It was only ordered for 2 liters with exertion at last OV on 09-28-13. She states that the pt has been having increased SOB with rest as well as sleep. I advised she will need OV to discuss this change. She states they have one on 6-8 and would like to keep this appt. Nothing further needed at this time. [place] Bing, CMA

## 2013-10-25 ENCOUNTER — Encounter: Payer: Self-pay | Admitting: Internal Medicine

## 2013-10-25 ENCOUNTER — Ambulatory Visit (INDEPENDENT_AMBULATORY_CARE_PROVIDER_SITE_OTHER): Payer: Medicare Other | Admitting: Internal Medicine

## 2013-10-25 VITALS — BP 112/64 | HR 62 | Temp 98.0°F | Ht 67.0 in | Wt 206.0 lb

## 2013-10-25 DIAGNOSIS — J8410 Pulmonary fibrosis, unspecified: Secondary | ICD-10-CM

## 2013-10-25 DIAGNOSIS — R0902 Hypoxemia: Secondary | ICD-10-CM

## 2013-10-25 DIAGNOSIS — IMO0001 Reserved for inherently not codable concepts without codable children: Secondary | ICD-10-CM

## 2013-10-25 NOTE — Progress Notes (Signed)
Subjective:    Patient ID: Alexa Lawson, female    DOB: 10-02-1933  MRN: [id number]    Brief patient profile:  77 yobf quit smoking 1998  with no resp problems until  1990's cough ? From ACEi then 2006 bad pneumonia in ICU Dr Owens Shark in [facility] 100% improved then  onset   cough and sob onset 2013 referred to pulmonary clinic 09/02/2012 by Dr Barkley Bruns with abn cxr periop for parathyroidectomy    History of Present Illness  09/02/2012 1st pulmonary eval cc indolent onset sob with dry hacking cough x 8-12 months   varied some with wt gain house work and gets sob if does more than slow pace at basline Has year round nose stuffy and runs year round clear x 15 y rec Wt loss, gerd diet   10/20/2012 f/u ov/Alexa Lawson re ? ILD Chief Complaint  Patient presents with  . Follow-up    Breathing doing the same. No new co's today.   not really limited by breathing from doing desired activities. Main complaint is chronic dry hacking daytime cough rec Pantoprazole (protonix) 40 mg   Take 30-60 min before first meal of the day and Pepcid 20 mg one bedtime until return to office >  Did not follow instructions because "the meds ran out" - I thought you wanted me to stop them  GERD diet   01/20/2013 f/u ov/Alexa Lawson re ? ILD  Chief Complaint  Patient presents with  . Follow-up    Pt states breathing is unchanged and denies any new co's. Cough has improved some.  not limited by breathing from desired activities including "chasing my grandchildren around" Cough is dry and mostly with talking some better No h/o macrodantin exp, no h/o connective tissue dz. rec Please see patient coordinator before you leave today to have your [place] doctor fax [place] all your cxr and CT reports Continue acid suppression and diet as before.  Please schedule a follow up visit in 3 months but call sooner if needed with pft's and cxr    06/18/2013 f/u ov/Alexa Lawson re:  Chief Complaint  Patient presents with  . Followup  with PFT's    Pt c/o DOE for the past 2 to 3 months. Sats were 80%ra after walking to exam room and did not increase after rest and pursed lip breathing. Sats increased to 99% after pt placed on oxygen 2lpm continuous.    Still can do [facility]  Can't chase grand children  Urge to clear throat worse since stopped gerd rx, worse day than night   >>PPI/pepcid       08/03/2013 f/u ov/Alexa Lawson re: PF/ ex hypoxemia Chief Complaint  Patient presents with  . Follow-up    Pt states DOE progressively worse since the last visit.   becoming less active, doesn't actually have any grandchildren to chase around per daughter who thinks her mother is confused.  Having lots of issues with runny nose, need to clear throat day and at hs with cough then  >>chlortrimeton added   08/17/2013 Follow up and Med review  Patient returns for a two-week followup and medication review. We reviewed all her medications and organized them into a medication calendar with patient education. Appears the patient is taking her medications correctly. Daughter helps with her medications as daughter is a Marine scientist. Lab work. Last visit showed a sedimentation rate at 28, BNP at 265. , unrevealing CBC. Daughter does voice some concerns over patient having underlying sleep apnea.  Complains that patient has Snoring and wakes frequently at night . Tired during daytime.  We discussed a sleep study and they're in agreement to Patient does have notable desaturations with ambulation.  She does have normal oxygen saturations at rest. We discussed the initiation of oxygen. However, patient is still hesitant at this time. Would like to think about this more.  rec Sleep Study will  Be set up .  I will call regarding CT chest  Refer for Internal medicine establishment  Follow med calendar closely and bring to each visit.    09/28/2013 f/u ov/Alexa Lawson re: PF Chief Complaint  Patient presents with  . Follow-up    Pt reports no  improvement of Dyspnea since last OV. Pt states that with any exertion she becomes SOB.   some cough at hs, dry. rec Please see patient coordinator before you leave today  to schedule portable 02 2lpm with activity Try taking the chlortrimeton 2 at bedtime     10/25/2013 f/u ov/Alexa Lawson re: PF/ noct cough better on chlortrimeton  Chief Complaint  Patient presents with  . Follow-up    Pt states her breathing is unchanged. Not coughing as often.     No obvious day to day or daytime variabilty or assoc chronic cp or chest tightness, subjective wheeze overt sinus or hb symptoms. No unusual exp hx or h/o childhood pna/ asthma or knowledge of premature birth.  Sleeping ok without nocturnal  or early am exacerbation  of respiratory  c/o's or need for noct saba. Also denies any obvious fluctuation of symptoms with weather or environmental changes or other aggravating or alleviating factors except as outlined above   Current Medications, Allergies, Complete Past Medical History, Past Surgical History, Family History, and Social History were reviewed in [employer] record.  ROS  The following are not active complaints unless bolded sore throat, dysphagia, dental problems, itching, sneezing,  nasal congestion or excess/ purulent secretions, ear ache,   fever, chills, sweats, unintended wt loss, pleuritic or exertional cp, hemoptysis,  orthopnea pnd or leg swelling, presyncope, palpitations, heartburn, abdominal pain, anorexia, nausea, vomiting, diarrhea  or change in bowel or urinary habits, change in stools or urine, dysuria,hematuria,  rash, arthralgias, visual complaints, headache, numbness weakness or ataxia or problems with walking or coordination,  change in mood/affect or memory.          Objective:   Physical Exam  10/20/2012         218  > 01/20/2013   219  >   06/18/2013  211  >212 08/03/2013 >213 08/17/2013 > 09/28/2013 209 > 10/25/2013 206    HEENT: nl dentition, turbinates,  and orophanx. Nl external ear canals without cough reflex   NECK :  without JVD/Nodes/TM/ nl carotid upstrokes bilaterally   LUNGS: no acc muscle use, diminished BS in bases    CV:  RRR  no s3 or murmur or increase in P2, no edema   ABD:  soft and nontender with nl excursion in the supine position. 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 5 mm flat nodular density along the right major fissure, likely a subpleural lymph node    Lab Results  Component Value Date   ESRSEDRATE 26* 09/28/2013   ESRSEDRATE 28* 08/03/2013   ESRSEDRATE  43* 06/23/2013     Rheum profile neg ex ANA nucleolar at 1:640      Assessment & Plan:

## 2013-10-25 NOTE — Patient Instructions (Signed)
Please see patient coordinator before you leave today  to schedule a rheumatology eval   Ok to start zyrtec or clariton daily as per med calendar  Fidelis to adjust 02 to maintain saturations over 90%  Please schedule a follow up visit in 2 months but call sooner if needed

## 2013-10-26 NOTE — Assessment & Plan Note (Signed)
-  08/03/2013   Walked RA x one lap @ 185 stopped due to  Sob/ sat 88% > improved on 2lpm and completed 3 laps -   09/28/13 Patient Saturations on Room Air at Rest = 99% Patient Saturations on Room Air while Ambulating = 88% Patient Saturations on 2Liters of oxygen while Ambulating = 92%> rx 2lpm with ambulation only   Adequate control on present rx, reviewed > no change in rx needed

## 2013-10-26 NOTE — Assessment & Plan Note (Signed)
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 5 mm flat nodular density along the right major fissure, likely a subpleural lymph node - 09/28/13 rheum profile neg ex ana Pos nucleolar 1:640  Next step is rheum eval to be sure this is not some form of collagen vasc dz related PF and if not should be considered for OFEV trial   In meantime no change rx needed     Each maintenance medication was reviewed in detail including most importantly the difference between maintenance and as needed and under what circumstances the prns are to be used.  Please see instructions for details which were reviewed in writing and the patient given a copy.

## 2013-11-10 LAB — LIPID PANEL: Cholesterol: 195 mg/dL (ref 0–200)

## 2013-11-10 LAB — HEMOGLOBIN A1C: HEMOGLOBIN A1C: 6.2 % — AB (ref 4.0–6.0)

## 2013-11-10 LAB — BASIC METABOLIC PANEL
BUN: 12 mg/dL (ref 4–21)
Creatinine: 1.3 mg/dL — AB (ref 0.5–1.1)
GLUCOSE: 124 mg/dL
Potassium: 4.3 mmol/L (ref 3.4–5.3)
SODIUM: 138 mmol/L (ref 137–147)

## 2013-11-10 LAB — TSH: TSH: 1.09 u[IU]/mL (ref 0.41–5.90)

## 2013-11-18 ENCOUNTER — Encounter: Payer: Self-pay | Admitting: Internal Medicine

## 2013-11-18 ENCOUNTER — Telehealth: Payer: Self-pay | Admitting: Adult Health

## 2013-11-18 DIAGNOSIS — G4719 Other hypersomnia: Secondary | ICD-10-CM

## 2013-11-18 NOTE — Telephone Encounter (Signed)
very mild obstructive sleep apnea/hypopnea syndrome, with an AHI of 8 events per hour and oxygen desaturation transiently as low as 79%. Treatment for this degree of sleep apnea can include a trial of weight loss alone, upper airway surgery, dental appliance, and also CPAP. Clinical correlation is suggested.  2) oxygen desaturation also occurred independent of the patient's obstructive events, and she spent approximately 38 minutes the entire night less than 88%.    Sleep study >>showed mild OSA  Refer to sleep specialist to consider starting CPAP At bedtime   On  O2 at At bedtime  D/t desats    >phone conversation -pt declines CPAP .  Cont on O2  Discuss on return ov with Dr. Melvyn Novas

## 2013-12-13 ENCOUNTER — Telehealth: Payer: Self-pay

## 2013-12-13 NOTE — Telephone Encounter (Signed)
New patient Moved from [place] PCP 25-30 years ago per patient   Medication and allergies:  Reviewed and updated  90 day supply/mail order: n/a Local pharmacy:  [facility] [place], [place].   Immunizations due:  See below   A/P: No changes to personal, family history or past surgical hx PAP- no longer receives, hx. hysterectomy [facility]- 10/13/11- small internal hemorrhoids, extensive pandiverticulosis, 1 large adenomatous polyp; repeat in 5 years--Dr. Deatra Ina [facility]- 08/02/10- additional imaging needed---08/14/10-right breast-negative Tdap- DUE PNA- 05/20/06 Shingles- DUE Foot Exam-DUE Eye Exam- May 2015- Dr. Herbert Deaner Urine Microalbumin-DUE Hemoglobin A1c-DUE--pt states that this may have been drawn by Dr. Carlis Abbott 3 weeks ago  To Discuss with Provider: Nothing at this time.

## 2013-12-14 ENCOUNTER — Ambulatory Visit (INDEPENDENT_AMBULATORY_CARE_PROVIDER_SITE_OTHER): Payer: Medicare Other | Admitting: Family Medicine

## 2013-12-14 ENCOUNTER — Encounter: Payer: Self-pay | Admitting: Family Medicine

## 2013-12-14 VITALS — BP 154/96 | HR 67 | Temp 97.3°F | Ht 67.0 in | Wt 203.0 lb

## 2013-12-14 DIAGNOSIS — N390 Urinary tract infection, site not specified: Secondary | ICD-10-CM

## 2013-12-14 DIAGNOSIS — I10 Essential (primary) hypertension: Secondary | ICD-10-CM

## 2013-12-14 DIAGNOSIS — K2100 Gastro-esophageal reflux disease with esophagitis, without bleeding: Secondary | ICD-10-CM

## 2013-12-14 DIAGNOSIS — E1165 Type 2 diabetes mellitus with hyperglycemia: Secondary | ICD-10-CM

## 2013-12-14 DIAGNOSIS — J8410 Pulmonary fibrosis, unspecified: Secondary | ICD-10-CM

## 2013-12-14 DIAGNOSIS — R0602 Shortness of breath: Secondary | ICD-10-CM

## 2013-12-14 DIAGNOSIS — IMO0002 Reserved for concepts with insufficient information to code with codable children: Secondary | ICD-10-CM

## 2013-12-14 DIAGNOSIS — E118 Type 2 diabetes mellitus with unspecified complications: Principal | ICD-10-CM

## 2013-12-14 LAB — POCT URINALYSIS DIPSTICK
BILIRUBIN UA: NEGATIVE
Glucose, UA: NEGATIVE
KETONES UA: NEGATIVE
Nitrite, UA: POSITIVE
PH UA: 6
Spec Grav, UA: >=1.03
Urobilinogen, UA: 0.2

## 2013-12-14 MED ORDER — LOSARTAN POTASSIUM 100 MG PO TABS: 100.0000 mg | ORAL_TABLET | Freq: Every day | ORAL | Status: DC

## 2013-12-14 MED ORDER — PANTOPRAZOLE SODIUM 40 MG PO TBEC: 40.0000 mg | DELAYED_RELEASE_TABLET | Freq: Every day | ORAL | Status: DC

## 2013-12-14 NOTE — Patient Instructions (Signed)
Idiopathic Pulmonary Fibrosis Idiopathic pulmonary fibrosis is an inflammation (soreness and irritation) in the lungs which eventually causes scarring. This usually shows in middle age over a several year time period. The cause is unknown (idiopathic). Usually death occurs after several years but there are no time tables which will predict perfectly what the course of the illness will be. It affects males and females equally. In this condition there is a formation of fibrous (tough leathery) tissue in the small ducts which carry air to and from your lungs. Because of this, the lungs do not work as well as they should for taking in oxygen from the air you breathe and getting rid of wastes (carbon dioxide). Because the lungs are damaged, there may be more problems with infections or the heart.  Other problems may include pulmonary hypertension (high blood pressure in the lungs), and formation of blood clots. Some symptoms of this illness are:  Coughing and breathing difficulties; cough is usually dry and hacking.  Bluish (cyanotic) skin and lips due to lack of circulating oxygen.  Loss of appetite.  Loss of strength which comes from just the increased work of breathing.  Rapid, shallow breathing occur with moderate exercise and later even while resting.  Increasing shortness of breath (dyspnea) which progresses as the disease gets worse.  Weight loss and fatigue due partly to the increased work of breathing.  Clubbing of the fingers (the ends of the fingers become rounded and enlarged). DIAGNOSIS  A diagnosis of idiopathic pulmonary fibrosis may be suspected based on exam and a patient's history.  Specialized X-rays, pulmonary function tests, pulse oximetry, and laboratory tests including blood gasses help confirm the problem.  Sometimes a biopsy is done and a small piece of lung tissue is removed. This may be done through a bronchoscope or during an operation in which the chest is opened. This  is looked at under a microscope by a specialist who can tell what the lung problem is. CAUSES If the cause of pulmonary fibrosis is known, it is no longer known as idiopathic. Several causes of pulmonary fibrosis include:  Occupational and environmental exposures to asbestos, silica, and or metal dusts.  Illegal or street drug use.  Agricultural workers may inhale substances, such as moldy hay, which can cause an allergic reaction in the lung. This reaction is called Farmer's Lung and can cause pulmonary fibrosis. Some other fumes found on farms are directly toxic to the lungs.  Exposure of the lungs to radiation.  Collagen diseases.  Sarcoidosis is a disease which forms granulomas (areas of inflammatory cells), which can attack any area of the body but most frequently affects the lungs.  Drugs. Certain medicines may have the undesirable side effect of causing pulmonary fibrosis. Check with your doctor about the medicines you are taking and ask about any possible side effects.  Some cases of pulmonary fibrosis seem to be genetic. TREATMENT   There are no drugs currently approved for the treatment of pulmonary fibrosis. Steroids (a potent medication which cuts down on inflammation) are sometimes given to prevent lung changes before they become permanent. High doses may be recommended at first, followed by lower maintenance dosages. Other medications may be tried if steroids do not work.  Lung disease may be monitored with X-rays and laboratory work.  Oxygen may be helpful if oxygen in the blood is diminished. This improves the quality of life. Your caregiver will give you a prescription for this if it is helpful.  Antibiotics are used  for treatment of infections.  Exercise may be beneficial.  Lung transplants are being investigated and a single lung transplant may be considered for some patients.  Influenza vaccine and pneumococcal pneumonia vaccine are both recommended for people  with IPF or any lung disease. These two shots may help keep you healthy. Document Released: 07/27/2003 Document Revised: 07/29/2011 Document Reviewed: 05/06/2005 [facility] Patient Information 2015 [place], [place]. This information is not intended to replace advice given to you by your health care provider. Make sure you discuss any questions you have with your health care provider.

## 2013-12-14 NOTE — Progress Notes (Signed)
Pre visit review using our clinic review tool, if applicable. No additional management support is needed unless otherwise documented below in the visit note.

## 2013-12-14 NOTE — Progress Notes (Signed)
Subjective:    Patient ID: Alexa Lawson, female    DOB: 07/06/1933, 80 y.o.   MRN: [id number]  HPI Pt here with daughter to establish.  Pt hx pulm fibrosis--seen by Dr Melvyn Novas.  She also sees Dr Naomie Dean for parathyroid and dm.  She is also seeing Rheum-- Dr Charlestine Night--- ? RA--- still unsure per pt and her daughter.  Pt c/o inc sob over last several months.  They are concerned because renal function abn as well.  No chest pain.    Review of Systems As above Past Medical History  Diagnosis Date  . Hypertension   . Diabetes mellitus   . Diverticulosis   . Anemia   . Hypothyroidism   . Shortness of breath     seeing Dr. Melvyn Novas (pulmonologist)   History   Social History  . Marital Status: Single    Spouse Name: N/A    Number of Children: 1  . Years of Education: N/A   Occupational History  . Retired    Social History Main Topics  . Smoking status: Former Smoker -- 0.25 packs/day for 35 years    Types: Cigarettes    Quit date: 03/20/1997  . Smokeless tobacco: Never Used  . Alcohol Use: No  . Drug Use: No  . Sexual Activity: Not on file   Other Topics Concern  . Not on file   Social History Narrative  . No narrative on file   Current Outpatient Prescriptions  Medication Sig Dispense Refill  . aspirin 325 MG tablet Take 325 mg by mouth daily.      . carvedilol (COREG) 25 MG tablet Take 25 mg by mouth 2 (two) times daily with a meal.      . cetirizine (ZYRTEC) 10 MG tablet Take 10 mg by mouth as needed for allergies.      . chlorpheniramine (CHLOR-TRIMETON) 4 MG tablet Take 4 mg by mouth every 4 (four) hours as needed (drippy nose, drainage, throat clearing).      . Iron-Vitamin C (VITRON-C) 65-125 MG TABS Take 1 tablet by mouth 2 (two) times daily.       Marland Kitchen levothyroxine (SYNTHROID, LEVOTHROID) 75 MCG tablet Take 75 mcg by mouth every morning.       . metFORMIN (GLUMETZA) 500 MG (MOD) 24 hr tablet Take 500 mg by mouth daily with breakfast.      . Multiple  Vitamins-Minerals (MULTIVITAMIN WITH MINERALS) tablet Take 1 tablet by mouth daily.      . pantoprazole (PROTONIX) 40 MG tablet Take 1 tablet (40 mg total) by mouth daily. Take 30-60 min before first meal of the day  30 tablet  11  . Vitamin D, Ergocalciferol, (DRISDOL) 50000 UNITS CAPS Take 50,000 Units by mouth every 14 (fourteen) days.       Marland Kitchen losartan (COZAAR) 100 MG tablet Take 1 tablet (100 mg total) by mouth daily.  30 tablet  2   No current facility-administered medications for this visit.   Family History  Problem Relation Age of Onset  . Cancer Mother   . Diabetes Maternal Uncle   . Diabetes Maternal Uncle    Past Surgical History  Procedure Laterality Date  . Parathyroid sx  2005  . Colonoscopy  10/13/2011    Procedure: COLONOSCOPY;  Surgeon: Juanita Craver, MD;  Location: [facility] ENDOSCOPY;  Service: Endoscopy;  Laterality: N/A;  . Abdominal hysterectomy  07/1984  . Cataract surgery with lens implants  1998 and 2000    both eyes  .  Parathyroidectomy N/A 08/27/2012    Procedure: PARATHYROIDECTOMY with neck exploration ;  Surgeon: Odis Hollingshead, MD;  Location: [facility] ORS;  Service: General;  Laterality: N/A;       Objective:   Physical Exam  BP 154/96  Pulse 67  Temp(Src) 97.3 F (36.3 C) (Oral)  Ht _0  (1.702 m)  Wt 203 lb (92.08 kg)  BMI 31.79 kg/m2  SpO2 97% General appearance: alert, cooperative, appears stated age and no distress Head: Normocephalic, without obvious abnormality, atraumatic Nose: Nares normal. Septum midline. Mucosa normal. No drainage or sinus tenderness. Throat: lips, mucosa, and tongue normal; teeth and gums normal Neck: no adenopathy, supple, symmetrical, trachea midline and thyroid not enlarged, symmetric, no tenderness/mass/nodules Lungs: clear to auscultation bilaterally Heart: irregular Extremities: extremities normal, atraumatic, no cyanosis or edema  ekg-- see ekg     Assessment & Plan:  1. Type II or unspecified type diabetes  mellitus with unspecified complication, uncontrolled Controlled --- see labs - Basic metabolic panel - Hepatic function panel - Lipid panel - Microalbumin / creatinine urine ratio - POCT urinalysis dipstick  2. Gastroesophageal reflux disease with esophagitis  - pantoprazole (PROTONIX) 40 MG tablet; Take 1 tablet (40 mg total) by mouth daily. Take 30-60 min before first meal of the day  Dispense: 30 tablet; Refill: 11  3. Essential hypertension Elevated today-- pt stopped exforge secondary to cost - losartan (COZAAR) 100 MG tablet; Take 1 tablet (100 mg total) by mouth daily.  Dispense: 30 tablet; Refill: 2  4. SOB (shortness of breath) Worsening---  pulm fibrosis vs cardiac  - EKG 12-Lead---  abnormal - 2D Echocardiogram without contrast; Future  5. Pulmonary fibrosis Per pulmonary - 2D Echocardiogram without contrast; Future  6. Urinary tract infection, site not specified   - Urine Culture

## 2013-12-15 ENCOUNTER — Telehealth: Payer: Self-pay | Admitting: Family Medicine

## 2013-12-15 ENCOUNTER — Other Ambulatory Visit: Payer: Self-pay | Admitting: Family Medicine

## 2013-12-15 DIAGNOSIS — E1121 Type 2 diabetes mellitus with diabetic nephropathy: Secondary | ICD-10-CM

## 2013-12-15 LAB — LIPID PANEL
CHOLESTEROL: 216 mg/dL — AB (ref 0–200)
HDL: 53.6 mg/dL (ref >39.00)
LDL CALC: 145 mg/dL — AB (ref 0–99)
NONHDL: 162.4
Total CHOL/HDL Ratio: 4
Triglycerides: 85 mg/dL (ref 0.0–149.0)
VLDL: 17 mg/dL (ref 0.0–40.0)

## 2013-12-15 LAB — MICROALBUMIN / CREATININE URINE RATIO
Creatinine,U: 317.2 mg/dL
Microalb Creat Ratio: 111.7 mg/g — ABNORMAL HIGH (ref 0.0–30.0)
Microalb, Ur: 354.1 mg/dL — ABNORMAL HIGH (ref 0.0–1.9)

## 2013-12-15 LAB — BASIC METABOLIC PANEL
BUN: 17 mg/dL (ref 6–23)
CO2: 27 mEq/L (ref 19–32)
Calcium: 8.8 mg/dL (ref 8.4–10.5)
Chloride: 105 mEq/L (ref 96–112)
Creatinine, Ser: 1.4 mg/dL — ABNORMAL HIGH (ref 0.4–1.2)
GFR: 45.31 mL/min — AB (ref >60.00)
Glucose, Bld: 73 mg/dL (ref 70–99)
Potassium: 4.2 mEq/L (ref 3.5–5.1)
SODIUM: 140 meq/L (ref 135–145)

## 2013-12-15 LAB — HEPATIC FUNCTION PANEL
ALT: 12 U/L (ref 0–35)
AST: 20 U/L (ref 0–37)
Albumin: 3.6 g/dL (ref 3.5–5.2)
Alkaline Phosphatase: 75 U/L (ref 39–117)
BILIRUBIN DIRECT: 0.1 mg/dL (ref 0.0–0.3)
BILIRUBIN TOTAL: 0.7 mg/dL (ref 0.2–1.2)
Total Protein: 7.7 g/dL (ref 6.0–8.3)

## 2013-12-15 NOTE — Telephone Encounter (Signed)
Caller name: Crystal Relation to pt:Dr. [place] Office Call back number: [phone] Pharmacy:  Reason for call: [facility] need the lab results from 12/14/13 faxed to their office. (f) [phone]

## 2013-12-17 LAB — URINE CULTURE: Colony Count: >=100000

## 2013-12-17 NOTE — Telephone Encounter (Signed)
Labs have been faxed     KP

## 2013-12-22 ENCOUNTER — Ambulatory Visit (HOSPITAL_BASED_OUTPATIENT_CLINIC_OR_DEPARTMENT_OTHER)
Admission: RE | Admit: 2013-12-22 | Discharge: 2013-12-22 | Disposition: A | Discharge status: Home / Self Care | Payer: Medicare Other | Source: Ambulatory Visit | Attending: Family Medicine | Admitting: Family Medicine

## 2013-12-22 ENCOUNTER — Telehealth: Payer: Self-pay

## 2013-12-22 DIAGNOSIS — J8410 Pulmonary fibrosis, unspecified: Secondary | ICD-10-CM | POA: Insufficient documentation

## 2013-12-22 DIAGNOSIS — E785 Hyperlipidemia, unspecified: Secondary | ICD-10-CM

## 2013-12-22 DIAGNOSIS — R0602 Shortness of breath: Secondary | ICD-10-CM | POA: Insufficient documentation

## 2013-12-22 DIAGNOSIS — I369 Nonrheumatic tricuspid valve disorder, unspecified: Secondary | ICD-10-CM

## 2013-12-22 DIAGNOSIS — R809 Proteinuria, unspecified: Secondary | ICD-10-CM

## 2013-12-22 DIAGNOSIS — R7989 Other specified abnormal findings of blood chemistry: Secondary | ICD-10-CM

## 2013-12-22 DIAGNOSIS — E119 Type 2 diabetes mellitus without complications: Secondary | ICD-10-CM

## 2013-12-22 MED ORDER — CIPROFLOXACIN HCL 500 MG PO TABS
500.0000 mg | ORAL_TABLET | Freq: Two times a day (BID) | ORAL | Status: DC
Start: 2013-12-22 — End: 2014-01-06

## 2013-12-22 MED ORDER — SIMVASTATIN 20 MG PO TABS: 20.0000 mg | ORAL_TABLET | Freq: Every day | ORAL | Status: DC

## 2013-12-22 NOTE — Telephone Encounter (Signed)
cipro 500 mg 1 po bid for 5 days ---+ UTI Notes Recorded by Rosalita Chessman, DO on 12/15/2013 at 1:45 PM microalbumin--- and elevation cr----refer to nephrology  Cholesterol--- LDL goal < 70, HDL >40, TG < 150. Diet and exercise will increase HDL and decrease LDL and TG. Fish, Fish Oil, Flaxseed oil will also help increase the HDL and decrease Triglycerides. Recheck labs in 3 months---- She needs to be a med for cholesterol. Has she been on them in the past? If no---- zocor 20 mg #30 1 po qpm , 2 refills 272.4 250.00 Lipid, hep, bmp, hgb1c   Spoke with patient and her daughter and made them aware of the above results, they both voiced understanding and has agreed to start med's. I made her aware it may be a few weeks before she can get to see the Nephrologist so do not expect a call right away, she voiced understanding.

## 2013-12-22 NOTE — Progress Notes (Signed)
Echocardiogram 2D Echocardiogram has been performed.  Alexa Lawson 12/22/2013, 11:37 AM

## 2013-12-22 NOTE — Telephone Encounter (Signed)
Message copied by Ewing Schlein on Wed Dec 22, 2013  9:43 AM ------      Message from: Rosalita Chessman      Created: Fri Dec 17, 2013  1:45 PM       cipro 500 mg 1 po bid for 5 days ---+ UTI ------

## 2013-12-23 ENCOUNTER — Other Ambulatory Visit: Payer: Self-pay | Admitting: Family Medicine

## 2013-12-23 DIAGNOSIS — I272 Pulmonary hypertension, unspecified: Secondary | ICD-10-CM | POA: Insufficient documentation

## 2013-12-27 ENCOUNTER — Encounter: Payer: Self-pay | Admitting: Internal Medicine

## 2013-12-27 ENCOUNTER — Ambulatory Visit (HOSPITAL_BASED_OUTPATIENT_CLINIC_OR_DEPARTMENT_OTHER): Payer: Medicare Other | Admitting: Cardiology

## 2013-12-27 ENCOUNTER — Ambulatory Visit (INDEPENDENT_AMBULATORY_CARE_PROVIDER_SITE_OTHER): Payer: Medicare Other | Admitting: Internal Medicine

## 2013-12-27 VITALS — BP 120/70 | HR 60 | Temp 98.1°F | Ht 67.0 in | Wt 212.0 lb

## 2013-12-27 DIAGNOSIS — J8410 Pulmonary fibrosis, unspecified: Secondary | ICD-10-CM

## 2013-12-27 DIAGNOSIS — IMO0001 Reserved for inherently not codable concepts without codable children: Secondary | ICD-10-CM

## 2013-12-27 DIAGNOSIS — R0602 Shortness of breath: Secondary | ICD-10-CM

## 2013-12-27 DIAGNOSIS — I2789 Other specified pulmonary heart diseases: Secondary | ICD-10-CM

## 2013-12-27 DIAGNOSIS — G4710 Hypersomnia, unspecified: Secondary | ICD-10-CM

## 2013-12-27 DIAGNOSIS — J9611 Chronic respiratory failure with hypoxia: Secondary | ICD-10-CM

## 2013-12-27 DIAGNOSIS — R609 Edema, unspecified: Secondary | ICD-10-CM

## 2013-12-27 DIAGNOSIS — R0902 Hypoxemia: Secondary | ICD-10-CM

## 2013-12-27 DIAGNOSIS — I272 Pulmonary hypertension, unspecified: Secondary | ICD-10-CM

## 2013-12-27 DIAGNOSIS — G4719 Other hypersomnia: Secondary | ICD-10-CM

## 2013-12-27 DIAGNOSIS — J9610 Chronic respiratory failure, unspecified whether with hypoxia or hypercapnia: Secondary | ICD-10-CM

## 2013-12-27 NOTE — Progress Notes (Signed)
Subjective:    Patient ID: Alexa Lawson, female    DOB: [birth year]  MRN: [id number]    Brief patient profile:  17 yobf quit smoking 1998  with no resp problems until  1990's cough ? From ACEi then 2006 bad pneumonia in ICU Dr Owens Shark in [facility] 100% improved then  onset   cough and sob onset 2013 referred to pulmonary clinic 09/02/2012 by Dr Barkley Bruns with abn cxr periop for parathyroidectomy    History of Present Illness  09/02/2012 1st pulmonary eval cc indolent onset sob with dry hacking cough x 8-12 months   varied some with wt gain house work and gets sob if does more than slow pace at basline Has year round nose stuffy and runs year round clear x 15 y rec Wt loss, gerd diet   10/20/2012 f/u ov/Story Alexa Lawson re ? ILD Chief Complaint  Patient presents with  . Follow-up    Breathing doing the same. No new co's today.   not really limited by breathing from doing desired activities. Main complaint is chronic dry hacking daytime cough rec Pantoprazole (protonix) 40 mg   Take 30-60 min before first meal of the day and Pepcid 20 mg one bedtime until return to office >  Did not follow instructions because "the meds ran out" - I thought you wanted me to stop them " GERD diet   01/20/2013 f/u ov/Alexa Lawson re ? ILD  Chief Complaint  Patient presents with  . Follow-up    Pt states breathing is unchanged and denies any new co's. Cough has improved some.  not limited by breathing from desired activities including "chasing my grandchildren around" Cough is dry and mostly with talking some better No h/o macrodantin exp, no h/o connective tissue dz. rec Please see patient coordinator before you leave today to have your [place] doctor fax [place] all your cxr and CT reports Continue acid suppression and diet as before.  Please schedule a follow up visit in 3 months but call sooner if needed with pft's and cxr    06/18/2013 f/u ov/Alexa Lawson re:  Chief Complaint  Patient presents with  . Followup  with PFT's    Pt c/o DOE for the past 2 to 3 months. Sats were 80%ra after walking to exam room and did not increase after rest and pursed lip breathing. Sats increased to 99% after pt placed on oxygen 2lpm continuous.    Still can do [employer] [facility]  Can't chase grand children  Urge to clear throat worse since stopped gerd rx, worse day than night   >>PPI/pepcid       08/03/2013 f/u ov/Alexa Lawson re: PF/ ex hypoxemia Chief Complaint  Patient presents with  . Follow-up    Pt states DOE progressively worse since the last visit.   becoming less active, doesn't actually have any grandchildren to chase around per daughter who thinks her mother is confused.  Having lots of issues with runny nose, need to clear throat day and at hs with cough then  >>chlortrimeton added   08/17/2013 Follow up and Med review  Patient returns for a two-week followup and medication review. We reviewed all her medications and organized them into a medication calendar with patient education. Appears the patient is taking her medications correctly. Daughter helps with her medications as daughter is a Marine scientist. Lab work. Last visit showed a sedimentation rate at 28, BNP at 265. , unrevealing CBC. Daughter does voice some concerns over patient having underlying sleep apnea.  Complains that patient has Snoring and wakes frequently at night . Tired during daytime.  We discussed a sleep study and they're in agreement to Patient does have notable desaturations with ambulation.  She does have normal oxygen saturations at rest. We discussed the initiation of oxygen. However, patient is still hesitant at this time. Would like to think about this more.  rec Sleep Study will  Be set up .  I will call regarding CT chest  Refer for Internal medicine establishment  Follow med calendar closely and bring to each visit.    09/28/2013 f/u ov/Alexa Lawson re: PF Chief Complaint  Patient presents with  . Follow-up    Pt reports no  improvement of Dyspnea since last OV. Pt states that with any exertion she becomes SOB.   some cough at hs, dry. rec Please see patient coordinator before you leave today  to schedule portable 02 2lpm with activity Try taking the chlortrimeton 2 at bedtime     10/25/2013 f/u ov/Alexa Lawson re: PF/ noct cough better on chlortrimeton  Chief Complaint  Patient presents with  . Follow-up    Pt states her breathing is unchanged. Not coughing as often.    rec Ok to start zyrtec or clariton daily as per med calendar Peyton to adjust 02 to maintain saturations over 90%  rheumatology eval > tr[facility]low > rec cellcept/ turned down by Medicaid and can't afford it   12/27/2013 extended f/u ov/Alexa Lawson re: ILD with new dx PAH and new resp failure 02 at 3 lpm at bedtime and all during the day / no med calendar  Chief Complaint  Patient presents with  . Follow-up    Pt reports that her breathing is unchanged.   no longer able to do shopping at [facility] since March 2015 but not wearing approp 02 (using pulsed system) no longer coughing at all  - some slt L leg swelling but no calf pain   No obvious day to day or daytime variabilty or assoc chronic cp or chest tightness, subjective wheeze overt sinus or hb symptoms. No unusual exp hx or h/o childhood pna/ asthma or knowledge of premature birth.  Sleeping ok without nocturnal  or early am exacerbation  of respiratory  c/o's or need for noct saba. Also denies any obvious fluctuation of symptoms with weather or environmental changes or other aggravating or alleviating factors except as outlined above   Current Medications, Allergies, Complete Past Medical History, Past Surgical History, Family History, and Social History were reviewed in [employer] record.  ROS  The following are not active complaints unless bolded sore throat, dysphagia, dental problems, itching, sneezing,  nasal congestion or excess/ purulent secretions, ear ache,   fever, chills, sweats,  unintended wt loss, pleuritic or exertional cp, hemoptysis,  orthopnea pnd or leg swelling bilateral, minimal, presyncope, palpitations, heartburn, abdominal pain, anorexia, nausea, vomiting, diarrhea  or change in bowel or urinary habits, change in stools or urine, dysuria,hematuria,  rash, arthralgias, visual complaints, headache, numbness weakness or ataxia or problems with walking or coordination,  change in mood/affect or memory.          Objective:   Physical Exam  10/20/2012         218  > 01/20/2013   219  >   06/18/2013  211  >212 08/03/2013 >213 08/17/2013 > 09/28/2013 209 > 10/25/2013 206 > 12/27/2013  212    HEENT: nl dentition, turbinates, and orophanx. Nl external ear canals without cough reflex  NECK :  without JVD/Nodes/TM/ nl carotid upstrokes bilaterally   LUNGS: no acc muscle use, diminished BS in bases/ min crackles     CV:  RRR  no s3 or murmur /  Pos increase in P2, no edema   ABD:  soft and nontender with nl excursion in the supine position. 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 5 mm flat nodular density along the right major fissure, likely a subpleural lymph node    Lab Results  Component Value Date   ESRSEDRATE 26* 09/28/2013   ESRSEDRATE 28* 08/03/2013   ESRSEDRATE 43* 06/23/2013     Rheum profile neg ex ANA nucleolar at 1:640      Assessment & Plan:

## 2013-12-27 NOTE — Patient Instructions (Addendum)
Please see patient coordinator before you leave today  to get advanced give you whatever equipment you need to supply 3lpm continuous (not pulsed) with walking and a home health assessment for all your other home need  Please see patient coordinator before you leave today  to schedule bilateral venous dopplers and a V/Q scan   Please schedule a follow up office visit in 4 weeks, sooner if needed  With updated med calendar in hand

## 2013-12-27 NOTE — Progress Notes (Signed)
Bilateral lower venous duplex performed  

## 2013-12-28 ENCOUNTER — Ambulatory Visit (HOSPITAL_COMMUNITY)
Admission: RE | Admit: 2013-12-28 | Discharge: 2013-12-28 | Disposition: A | Discharge status: Home / Self Care | Payer: Medicare Other | Source: Ambulatory Visit | Attending: Internal Medicine | Admitting: Internal Medicine

## 2013-12-28 ENCOUNTER — Telehealth: Payer: Self-pay | Admitting: Internal Medicine

## 2013-12-28 DIAGNOSIS — R0602 Shortness of breath: Secondary | ICD-10-CM | POA: Insufficient documentation

## 2013-12-28 DIAGNOSIS — I272 Pulmonary hypertension, unspecified: Secondary | ICD-10-CM

## 2013-12-28 DIAGNOSIS — I2789 Other specified pulmonary heart diseases: Secondary | ICD-10-CM | POA: Insufficient documentation

## 2013-12-28 MED ORDER — TECHNETIUM TC 99M DIETHYLENETRIAME-PENTAACETIC ACID: 44.7000 | Freq: Once | INTRAVENOUS | Status: AC | PRN

## 2013-12-28 MED ORDER — TECHNETIUM TO 99M ALBUMIN AGGREGATED
5.0000 | Freq: Once | INTRAVENOUS | Status: AC | PRN
  Administered 2013-12-28: 5 via INTRAVENOUS

## 2013-12-28 NOTE — Assessment & Plan Note (Signed)
-  08/03/2013   Walked RA x one lap @ 185 stopped due to  Sob/ sat 88% > improved on 2lpm and completed 3 laps -   09/28/13 Patient Saturations on Room Air at Rest = 99% Patient Saturations on Room Air while Ambulating = 88% Patient Saturations on 2Liters of oxygen while Ambulating = 92%> rx 2lpm with ambulation only  - 12/27/2013  Walked 3lpm non pusled 2 laps @ 185 ft each stopped due to fatigue > sob, no desat    Therefore needs 3lpm continuous, not pusled, and recheck ono 3lpm

## 2013-12-28 NOTE — Progress Notes (Signed)
Quick Note:  lmtcb ______

## 2013-12-28 NOTE — Assessment & Plan Note (Addendum)
-  Echo 12/22/13 - Ventricular septum: Interventricular septum is D-shaped in systole and diastole, suggesting RV pressure/volume overload. - Aortic valve: There was no stenosis. - Mitral valve: There was mild regurgitation. - Left atrium: The atrium was mildly dilated. - Right ventricle: The cavity size was severely dilated. Systolic function was moderately reduced. - Right atrium: The atrium was moderately dilated. - Tricuspid valve: There was severe regurgitation. Peak RV-RA gradient (S): 102 mm Hg. - Pulmonary arteries: PA peak pressure: 110 mm Hg - Sleep study 10/02/13 only mild osa but desat ? On RA (was not supposed to be off 3lpm so rec repeat ono on 3lpm 12/28/2013  - Venous dopplers neg 12/27/2013 bilaterally  - VQ lung scan 12/29/13   ILD Assoc with this degree of PAH, if not due to occult PE, is typical of scleroderma or MCTD > will notify Dr Charlestine Night via copy of this note

## 2013-12-28 NOTE — Telephone Encounter (Signed)
Called and spoke with pt and she is aware of doppler study results per MW>  Nothing further is needed.

## 2013-12-28 NOTE — Assessment & Plan Note (Signed)
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 5 mm flat nodular density along the right major fissure, likely a subpleural lymph node - 09/28/13 rheum profile neg ex ana Pos nucleolar 1:640> rheum w/u by [facility] > rec cellcept but turned down by Medicaid   Clearly losing ground and will need to be considered again for immune modifier > Defer to Dr Charlestine Night

## 2013-12-28 NOTE — Assessment & Plan Note (Signed)
Sleep study 09/22/13 >>>+mild OSA , AHI 8/h w/ noct desats  Pt declines CPAP 11/18/2013  (see phone note)  > rec Use O2 At bedtime  And repeat ono on 3lpm as of 12/28/2013

## 2014-01-06 ENCOUNTER — Encounter: Payer: Self-pay | Admitting: Family Medicine

## 2014-01-06 ENCOUNTER — Ambulatory Visit (INDEPENDENT_AMBULATORY_CARE_PROVIDER_SITE_OTHER): Payer: Medicare Other | Admitting: Family Medicine

## 2014-01-06 VITALS — BP 132/80 | HR 97 | Temp 97.3°F | Wt 209.8 lb

## 2014-01-06 DIAGNOSIS — I272 Pulmonary hypertension, unspecified: Secondary | ICD-10-CM

## 2014-01-06 DIAGNOSIS — I071 Rheumatic tricuspid insufficiency: Secondary | ICD-10-CM

## 2014-01-06 DIAGNOSIS — I519 Heart disease, unspecified: Secondary | ICD-10-CM

## 2014-01-06 DIAGNOSIS — I079 Rheumatic tricuspid valve disease, unspecified: Secondary | ICD-10-CM

## 2014-01-06 DIAGNOSIS — I5189 Other ill-defined heart diseases: Secondary | ICD-10-CM

## 2014-01-06 DIAGNOSIS — I2789 Other specified pulmonary heart diseases: Secondary | ICD-10-CM

## 2014-01-06 DIAGNOSIS — R609 Edema, unspecified: Secondary | ICD-10-CM

## 2014-01-06 MED ORDER — FUROSEMIDE 20 MG PO TABS: 20.0000 mg | ORAL_TABLET | Freq: Every day | ORAL | Status: DC

## 2014-01-06 NOTE — Patient Instructions (Signed)

## 2014-01-06 NOTE — Progress Notes (Signed)
   Subjective:    Patient ID: Alexa Lawson, female    DOB: Sep 29, 1933, 80 y.o.   MRN: [id number]  HPI Pt here with her daughter c/o swelling in low ext.  Pt is now on con't O2.  Daughter states her stamina is worsening.  She is requesting a wheelchair and shower seat etc.  No other new complaints.     Review of Systems    as above Objective:   Physical Exam BP 132/80  Pulse 97  Temp(Src) 97.3 F (36.3 C) (Oral)  Wt 209 lb 12.8 oz (95.165 kg)  SpO2 92% General appearance: alert, cooperative and no distress Throat: lips, mucosa, and tongue normal; teeth and gums normal Neck: no adenopathy, supple, symmetrical, trachea midline and thyroid not enlarged, symmetric, no tenderness/mass/nodules Lungs: diminished breath sounds bilaterally Heart: S1, S2 normal, + murmur Extremities: edema +1 pitting edema        Assessment & Plan:  1. Pulmonary HTN Pt getting more and more sob.  Struggling to walk any distance.  Daughter requesting wheelchair as well as other things for house--home health consulted to assess situation - Ambulatory referral to [place] - Ambulatory referral to Cardiology  2. Edema Elevated legs - furosemide (LASIX) 20 MG tablet; Take 1 tablet (20 mg total) by mouth daily.  Dispense: 30 tablet; Refill: 3  3. Diastolic dysfunction   - Ambulatory referral to Cardiology  4. Severe tricuspid regurgitation

## 2014-01-07 ENCOUNTER — Telehealth: Payer: Self-pay

## 2014-01-07 NOTE — Telephone Encounter (Signed)
Home health referral has been sent to [facility].      KP

## 2014-01-07 NOTE — Telephone Encounter (Signed)
Marj faxed the order to [facility] this AM. I will cancel care [facility] referral.     KP

## 2014-01-07 NOTE — Telephone Encounter (Signed)
Message copied by Ewing Schlein on Fri Jan 07, 2014  8:05 AM ------      Message from: Rosalita Chessman      Created: Thu Jan 06, 2014  4:46 PM       Pt needs home health referral ------

## 2014-01-12 ENCOUNTER — Telehealth: Payer: Self-pay | Admitting: Internal Medicine

## 2014-01-12 ENCOUNTER — Encounter: Payer: Self-pay | Admitting: Internal Medicine

## 2014-01-12 NOTE — Telephone Encounter (Signed)
Per MW- ONO on 3lpm was adequate, should continue 3 lpm with sleep  I spoke with the pt and notified her of this and she verbalized understanding

## 2014-01-20 ENCOUNTER — Ambulatory Visit (INDEPENDENT_AMBULATORY_CARE_PROVIDER_SITE_OTHER): Payer: Medicare Other | Admitting: Family Medicine

## 2014-01-20 ENCOUNTER — Encounter: Payer: Self-pay | Admitting: Family Medicine

## 2014-01-20 ENCOUNTER — Ambulatory Visit: Payer: Medicare Other | Admitting: Family Medicine

## 2014-01-20 VITALS — BP 130/78 | HR 71 | Temp 97.7°F | Wt 205.8 lb

## 2014-01-20 DIAGNOSIS — R609 Edema, unspecified: Secondary | ICD-10-CM

## 2014-01-20 DIAGNOSIS — J8410 Pulmonary fibrosis, unspecified: Secondary | ICD-10-CM

## 2014-01-20 MED ORDER — FUROSEMIDE 20 MG PO TABS: ORAL_TABLET | ORAL | Status: DC

## 2014-01-20 MED ORDER — NONFORMULARY OR COMPOUNDED ITEM: Status: DC

## 2014-01-20 NOTE — Patient Instructions (Signed)

## 2014-01-20 NOTE — Progress Notes (Signed)
Pre visit review using our clinic review tool, if applicable. No additional management support is needed unless otherwise documented below in the visit note.

## 2014-01-20 NOTE — Progress Notes (Signed)
   Subjective:    Patient ID: Alexa Lawson, female    DOB: [birth year], 80 y.o.   MRN: [id number]  HPI Pt here with her daughter to f/u edema-- it is much better but when she is walking, standing or sitting with legs down her legs/ feet swell worse.  She is now on 3L O2.     Review of Systems As above     Objective:   Physical Exam BP 130/78  Pulse 71  Temp(Src) 97.7 F (36.5 C) (Oral)  Wt 205 lb 12.8 oz (93.35 kg)  SpO2 95% General appearance: alert, cooperative, appears stated age and no distress Lungs: diminished breath sounds bilaterally Heart: S1, S2 normal Extremities: edema tr pitting edema b/l low ext        Assessment & Plan:  1. Edema Elevate legs, compression socks Can take second lasix prn rto 3 months - furosemide (LASIX) 20 MG tablet; 1-2 po qd  Dispense: 60 tablet; Refill: 3 - NONFORMULARY OR COMPOUNDED ITEM; Compression socks 20-30 mmhg  Dx edema  1 pair  Dispense: 1 each; Refill: 0 - NONFORMULARY OR COMPOUNDED ITEM; Rolling walker with seat  Dx sob,  Dispense: 1 each; Refill: 0  2. Pulmonary fibrosis Per pulmonary -- on 3L O2 - NONFORMULARY OR COMPOUNDED ITEM; Rolling walker with seat  Dx sob,  Dispense: 1 each; Refill: 0

## 2014-01-21 ENCOUNTER — Ambulatory Visit (INDEPENDENT_AMBULATORY_CARE_PROVIDER_SITE_OTHER): Payer: Medicare Other | Admitting: Cardiology

## 2014-01-21 ENCOUNTER — Encounter: Payer: Self-pay | Admitting: Cardiology

## 2014-01-21 VITALS — BP 130/94 | HR 60 | Ht 67.0 in | Wt 205.0 lb

## 2014-01-21 DIAGNOSIS — I10 Essential (primary) hypertension: Secondary | ICD-10-CM

## 2014-01-21 NOTE — Patient Instructions (Signed)
Your physician recommends that you continue on your current medications as directed. Please refer to the Current Medication list given to you today.  Your physician recommends that you return for lab work on 04/20/14 between 7:30am-5:15pm  Your physician recommends that you schedule a follow-up appointment on 04/22/14 @ 9:30am with Dr.Nelson

## 2014-01-21 NOTE — Progress Notes (Signed)
Patient ID: SOLYMAR GRACE, female   DOB: [birth year], 80 y.o.   MRN: [id number]     Patient Name: Alexa Lawson Date of Encounter: 01/21/2014  Primary Care Provider:  Garnet Koyanagi, DO Primary Cardiologist:  Dorothy Spark  Problem List   Past Medical History  Diagnosis Date  . Hypertension   . Diabetes mellitus   . Diverticulosis   . Anemia   . Hypothyroidism   . Shortness of breath     seeing Dr. Melvyn Novas (pulmonologist)   Past Surgical History  Procedure Laterality Date  . Parathyroid sx  2005  . Colonoscopy  10/13/2011    Procedure: COLONOSCOPY;  Surgeon: Juanita Craver, MD;  Location: [facility] ENDOSCOPY;  Service: Endoscopy;  Laterality: N/A;  . Abdominal hysterectomy  07/1984  . Cataract surgery with lens implants  1998 and 2000    both eyes  . Parathyroidectomy N/A 08/27/2012    Procedure: PARATHYROIDECTOMY with neck exploration ;  Surgeon: Odis Hollingshead, MD;  Location: [facility] ORS;  Service: General;  Laterality: N/A;    Allergies  No Known Allergies  HPI  80 yo female with h/o smoking (quit in 1998) with no resp problems until 1990's cough sec to ACEI, h/o pneumonia with resp failure requiring ICU in 2006, who developed progressively worsening DOE in March 2015. The patient was seen by Dr Marlene Lard and diagnosed with pulmonary fibrosis of unknown etiology, she is referred to rheumatology. She desaturated after walking 1 lap and was started on home O2 therapy.   She also noticed to have extra 10 lbs in 1 week span and noticed worsening of SOB. She was started on Lasix 20 mg po daily with some improvement. Her PCP Dr Leary Roca gave her an extra Lasix 20 mg po daily PRN.  She denies orthopnea, PND, she has mimimal LE edema, her BP fluctuates but has been in 140/90' lately.    Home Medications  Prior to Admission medications   Medication Sig Start Date End Date Taking? Authorizing Provider  aspirin 325 MG tablet Take 325 mg by mouth daily.   Yes Historical Provider, MD    carvedilol (COREG) 25 MG tablet Take 25 mg by mouth 2 (two) times daily with a meal.   Yes Historical Provider, MD  chlorpheniramine (CHLOR-TRIMETON) 4 MG tablet Take 4 mg by mouth every 4 (four) hours as needed (drippy nose, drainage, throat clearing).   Yes Historical Provider, MD  furosemide (LASIX) 20 MG tablet 1-2 po qd 01/20/14  Yes Yvonne R Lowne, DO  Iron-Vitamin C (VITRON-C) 65-125 MG TABS Take 1 tablet by mouth 2 (two) times daily.    Yes Historical Provider, MD  levothyroxine (SYNTHROID, LEVOTHROID) 75 MCG tablet Take 75 mcg by mouth every morning.    Yes Historical Provider, MD  losartan (COZAAR) 100 MG tablet Take 1 tablet (100 mg total) by mouth daily. 12/14/13  Yes Yvonne R Lowne, DO  metFORMIN (GLUMETZA) 500 MG (MOD) 24 hr tablet Take 500 mg by mouth daily with breakfast.   Yes Historical Provider, MD  Multiple Vitamins-Minerals (MULTIVITAMIN WITH MINERALS) tablet Take 1 tablet by mouth daily.   Yes Historical Provider, MD  NONFORMULARY OR COMPOUNDED ITEM Compression socks 20-30 mmhg  Dx edema  1 pair 01/20/14  Yes Rosalita Chessman, DO  NONFORMULARY OR COMPOUNDED ITEM Rolling walker with seat  Dx sob, 01/20/14  Yes Yvonne R Lowne, DO  pantoprazole (PROTONIX) 40 MG tablet Take 1 tablet (40 mg total) by mouth daily. Take 30-60 min before  first meal of the day 12/14/13  Yes Yvonne R Lowne, DO  simvastatin (ZOCOR) 20 MG tablet Take 1 tablet (20 mg total) by mouth at bedtime. 12/22/13  Yes Rosalita Chessman, DO  Vitamin D, Ergocalciferol, (DRISDOL) 50000 UNITS CAPS Take 50,000 Units by mouth every 14 (fourteen) days.    Yes Historical Provider, MD    Family History  Family History  Problem Relation Age of Onset  . Cancer Mother   . Diabetes Maternal Uncle   . Diabetes Maternal Uncle     Social History  History   Social History  . Marital Status: Single    Spouse Name: N/A    Number of Children: 1  . Years of Education: N/A   Occupational History  . Retired    Social History Main  Topics  . Smoking status: Former Smoker -- 0.25 packs/day for 35 years    Types: Cigarettes    Quit date: 03/20/1997  . Smokeless tobacco: Never Used  . Alcohol Use: No  . Drug Use: No  . Sexual Activity: Not on file   Other Topics Concern  . Not on file   Social History Narrative  . No narrative on file     Review of Systems, as per HPI, otherwise negative General:  No chills, fever, night sweats or weight changes.  Cardiovascular:  No chest pain, dyspnea on exertion, edema, orthopnea, palpitations, paroxysmal nocturnal dyspnea. Dermatological: No rash, lesions/masses Respiratory: No cough, dyspnea Urologic: No hematuria, dysuria Abdominal:   No nausea, vomiting, diarrhea, bright red blood per rectum, melena, or hematemesis Neurologic:  No visual changes, wkns, changes in mental status. All other systems reviewed and are otherwise negative except as noted above.  Physical Exam  Blood pressure 130/94, pulse 60, height _0  (1.702 m), weight 205 lb (92.987 kg).  General: Pleasant, NAD, on home O2 Psych: Normal affect. Neuro: Alert and oriented X 3. Moves all extremities spontaneously. HEENT: Normal  Neck: Supple without bruits + 6 cm JVD Lungs:  Resp regular and unlabored, CTA. Heart: RRR no s3, s4, loud holosystolic murmur, S3. Abdomen: Soft, non-tender, non-distended, BS + x 4.  Extremities: No clubbing, cyanosis, trace LE edema. DP/PT/Radials 2+ and equal bilaterally.  Labs:  No results found for this basename: CKTOTAL, CKMB, TROPONINI,  in the last 72 hours Lab Results  Component Value Date   WBC 8.0 09/28/2013   HGB 12.6 09/28/2013   HCT 39.3 09/28/2013   MCV 88.9 09/28/2013   PLT 280.0 09/28/2013    No results found for this basename: DDIMER   No components found with this basename: POCBNP,     Component Value Date/Time   NA 140 12/14/2013 1520   NA 138 11/10/2013   K 4.2 12/14/2013 1520   CL 105 12/14/2013 1520   CO2 27 12/14/2013 1520   GLUCOSE 73 12/14/2013  1520   BUN 17 12/14/2013 1520   BUN 12 11/10/2013   CREATININE 1.4* 12/14/2013 1520   CREATININE 1.3* 11/10/2013   CALCIUM 8.8 12/14/2013 1520   PROT 7.7 12/14/2013 1520   ALBUMIN 3.6 12/14/2013 1520   AST 20 12/14/2013 1520   ALT 12 12/14/2013 1520   ALKPHOS 75 12/14/2013 1520   BILITOT 0.7 12/14/2013 1520   GFRNONAA 45* 08/28/2012 0410   GFRAA 52* 08/28/2012 0410   Lab Results  Component Value Date   CHOL 216* 12/14/2013   HDL 53.60 12/14/2013   LDLCALC 145* 12/14/2013   TRIG 85.0 12/14/2013    Accessory Clinical  Findings  Echocardiogram - 12/22/13  Left ventricle: The cavity size was normal. Wall thickness was increased in a pattern of mild LVH. The estimated ejection fraction was 55%. Wall motion was normal; there were no regional wall motion abnormalities. Doppler parameters are consistent with abnormal left ventricular relaxation (grade 1 diastolic dysfunction). - Ventricular septum: Interventricular septum is D-shaped in systole and diastole, suggesting RV pressure/volume overload. - Aortic valve: There was no stenosis. - Mitral valve: There was mild regurgitation. - Left atrium: The atrium was mildly dilated. - Right ventricle: The cavity size was severely dilated. Systolic function was moderately reduced. - Right atrium: The atrium was moderately dilated. - Tricuspid valve: There was severe regurgitation. Peak RV-RA gradient (S): 102 mm Hg. - Pulmonary arteries: PA peak pressure: 110 mm Hg (S). - Systemic veins: IVC measured 2.0 cm with < 50% respirophasic variation, suggesting RA pressure 8 mmHg. - Pericardium, extracardiac: A trivial pericardial effusion was identified posterior to the heart.  Impressions:  - Normal LV size and systolic function, EF 80%. The interventricular septum is D-shaped, suggesting RV pressure/volume overload. The RV was severely dilated with moderate systolic dysfunction. Severe pulmonary hypertension. Severe tricuspid regurgitation due to  annular dilatation.  ECG: SR, RBBB, RVH   Assessment & Plan  A very pleasant 80 year old female  1. Grade 2 diastolic dysfunction and severe pulmonary hypertension - combination of elevated left sided pressures but predominantly secondary to pulmonary fibrosis. The patient is on home oxygen, scheduled to see rheumatologist. I agree with starting amlodipine 2.5 mg po daily.  At this point she is minimally fluid overloaded. I would continue the same regimen I would continue the same regimen with Lasix, her baseline weight is 205 lbs. She is advised to use extra lasix with 3 lbs gain overnight or 5 lbs in a week. If no improvement to call us.  2. HTN - add amlodipine 2.5 mg po daily  Follow up in 3 months with BMP prior to the visit.   Dorothy Spark, MD, [facility] 01/21/2014, 2:19 PM

## 2014-01-25 ENCOUNTER — Encounter: Payer: Self-pay | Admitting: Internal Medicine

## 2014-01-25 ENCOUNTER — Ambulatory Visit (INDEPENDENT_AMBULATORY_CARE_PROVIDER_SITE_OTHER): Payer: Medicare Other | Admitting: Internal Medicine

## 2014-01-25 VITALS — BP 136/72 | HR 75 | Ht 67.0 in | Wt 204.0 lb

## 2014-01-25 DIAGNOSIS — I272 Pulmonary hypertension, unspecified: Secondary | ICD-10-CM

## 2014-01-25 DIAGNOSIS — J8410 Pulmonary fibrosis, unspecified: Secondary | ICD-10-CM

## 2014-01-25 DIAGNOSIS — J9610 Chronic respiratory failure, unspecified whether with hypoxia or hypercapnia: Secondary | ICD-10-CM

## 2014-01-25 DIAGNOSIS — J9611 Chronic respiratory failure with hypoxia: Secondary | ICD-10-CM

## 2014-01-25 DIAGNOSIS — I2789 Other specified pulmonary heart diseases: Secondary | ICD-10-CM

## 2014-01-25 DIAGNOSIS — IMO0001 Reserved for inherently not codable concepts without codable children: Secondary | ICD-10-CM

## 2014-01-25 DIAGNOSIS — R0902 Hypoxemia: Secondary | ICD-10-CM

## 2014-01-25 NOTE — Patient Instructions (Addendum)
Please see patient coordinator before you leave today  to schedule 02 titration for ambulation   Goal for 02 is to keep the saturation over 90%   Please schedule a follow up visit in 3 months but call sooner if needed

## 2014-01-25 NOTE — Progress Notes (Signed)
Subjective:    Patient ID: Alexa Lawson, female    DOB: [birth year]  MRN: [id number]    Brief patient profile:  17 yobf quit smoking 1998  with no resp problems until  1990's cough ? From ACEi then 2006 bad pneumonia in ICU Dr Owens Shark in [facility] 100% improved then  onset   cough and sob onset 2013 referred to pulmonary clinic 09/02/2012 by Dr Barkley Bruns with abn cxr periop for parathyroidectomy    History of Present Illness  09/02/2012 1st pulmonary eval cc indolent onset sob with dry hacking cough x 8-12 months   varied some with wt gain house work and gets sob if does more than slow pace at basline Has year round nose stuffy and runs year round clear x 15 y rec Wt loss, gerd diet   10/20/2012 f/u ov/Wert re ? ILD Chief Complaint  Patient presents with  . Follow-up    Breathing doing the same. No new co's today.   not really limited by breathing from doing desired activities. Main complaint is chronic dry hacking daytime cough rec Pantoprazole (protonix) 40 mg   Take 30-60 min before first meal of the day and Pepcid 20 mg one bedtime until return to office >  Did not follow instructions because "the meds ran out" - I thought you wanted me to stop them " GERD diet   01/20/2013 f/u ov/Wert re ? ILD  Chief Complaint  Patient presents with  . Follow-up    Pt states breathing is unchanged and denies any new co's. Cough has improved some.  not limited by breathing from desired activities including "chasing my grandchildren around" Cough is dry and mostly with talking some better No h/o macrodantin exp, no h/o connective tissue dz. rec Please see patient coordinator before you leave today to have your [place] doctor fax [place] all your cxr and CT reports Continue acid suppression and diet as before.  Please schedule a follow up visit in 3 months but call sooner if needed with pft's and cxr    06/18/2013 f/u ov/Wert re:  Chief Complaint  Patient presents with  . Followup  with PFT's    Pt c/o DOE for the past 2 to 3 months. Sats were 80%ra after walking to exam room and did not increase after rest and pursed lip breathing. Sats increased to 99% after pt placed on oxygen 2lpm continuous.    Still can do [employer] [facility]  Can't chase grand children  Urge to clear throat worse since stopped gerd rx, worse day than night   >>PPI/pepcid       08/03/2013 f/u ov/Wert re: PF/ ex hypoxemia Chief Complaint  Patient presents with  . Follow-up    Pt states DOE progressively worse since the last visit.   becoming less active, doesn't actually have any grandchildren to chase around per daughter who thinks her mother is confused.  Having lots of issues with runny nose, need to clear throat day and at hs with cough then  >>chlortrimeton added   08/17/2013 Follow up and Med review  Patient returns for a two-week followup and medication review. We reviewed all her medications and organized them into a medication calendar with patient education. Appears the patient is taking her medications correctly. Daughter helps with her medications as daughter is a Marine scientist. Lab work. Last visit showed a sedimentation rate at 28, BNP at 265. , unrevealing CBC. Daughter does voice some concerns over patient having underlying sleep apnea.  Complains that patient has Snoring and wakes frequently at night . Tired during daytime.  We discussed a sleep study and they're in agreement to Patient does have notable desaturations with ambulation.  She does have normal oxygen saturations at rest. We discussed the initiation of oxygen. However, patient is still hesitant at this time. Would like to think about this more.  rec Sleep Study will  Be set up .  I will call regarding CT chest  Refer for Internal medicine establishment  Follow med calendar closely and bring to each visit.    09/28/2013 f/u ov/Wert re: PF Chief Complaint  Patient presents with  . Follow-up    Pt reports no  improvement of Dyspnea since last OV. Pt states that with any exertion she becomes SOB.   some cough at hs, dry. rec Please see patient coordinator before you leave today  to schedule portable 02 2lpm with activity Try taking the chlortrimeton 2 at bedtime     10/25/2013 f/u ov/Wert re: PF/ noct cough better on chlortrimeton  Chief Complaint  Patient presents with  . Follow-up    Pt states her breathing is unchanged. Not coughing as often.    rec Ok to start zyrtec or clariton daily as per med calendar [place] to adjust 02 to maintain saturations over 90%  rheumatology eval > tr[facility]low > rec cellcept/ turned down by Medicaid and can't afford it   12/27/2013 extended f/u ov/Wert re: ILD with new dx PAH and new resp failure 02 at 3 lpm at bedtime and all during the day / no med calendar  Chief Complaint  Patient presents with  . Follow-up    Pt reports that her breathing is unchanged.   no longer able to do shopping at [facility] since March 2015 but not wearing approp 02 (using pulsed system) no longer coughing at all  - some slt L leg swelling but no calf pain  rec Please see patient coordinator before you leave today  to get advanced give you whatever equipment you need to supply 3lpm continuous (not pulsed) with walking and a home health assessment for all your other home need> did not happen Please see patient coordinator before you leave today  to schedule bilateral venous dopplers and a V/Q scan > neg  Please schedule a follow up office visit in 4 weeks, sooner if needed  With updated med calendar in hand > did not do    01/25/2014 f/u ov/Wert re: no med calendar / Chief Complaint  Patient presents with  . Follow-up    Pt has no new complaints.  Has sob w exertion.  needs new handicap placard.   [place]/ [place]   No obvious day to day or daytime variabilty or assoc chronic cough or  cp or chest tightness, subjective wheeze overt sinus or hb symptoms. No unusual exp hx or h/o childhood  pna/ asthma or knowledge of premature birth.  Sleeping ok without nocturnal  or early am exacerbation  of respiratory  c/o's or need for noct saba. Also denies any obvious fluctuation of symptoms with weather or environmental changes or other aggravating or alleviating factors except as outlined above   Current Medications, Allergies, Complete Past Medical History, Past Surgical History, Family History, and Social History were reviewed in [employer] record.  ROS  The following are not active complaints unless bolded sore throat, dysphagia, dental problems, itching, sneezing,  nasal congestion or excess/ purulent secretions, ear ache,   fever, chills, sweats,  unintended wt loss, pleuritic or exertional cp, hemoptysis,  orthopnea pnd or leg swelling bilateral, minimal, presyncope, palpitations, heartburn, abdominal pain, anorexia, nausea, vomiting, diarrhea  or change in bowel or urinary habits, change in stools or urine, dysuria,hematuria,  rash, arthralgias, visual complaints, headache, numbness weakness or ataxia or problems with walking or coordination,  change in mood/affect or memory.          Objective:   Physical Exam  10/20/2012         218  > 01/20/2013   219  >   06/18/2013  211  >212 08/03/2013 >213 08/17/2013 > 09/28/2013 209 > 10/25/2013 206 > 12/27/2013  212 > 01/25/2014  204    HEENT: nl dentition, turbinates, and orophanx. Nl external ear canals without cough reflex   NECK :  without JVD/Nodes/TM/ nl carotid upstrokes bilaterally   LUNGS: no acc muscle use, diminished BS in bases/ min crackles     CV:  RRR  no s3 or murmur /  Pos increase in P2, no edema   ABD:  soft and nontender with nl excursion in the supine position. No bruits or organomegaly, bowel sounds nl  MS:  warm without deformities, calf tenderness, cyanosis or clubbing        HRCT 08/25/13  >>Pulmonary parenchymal pattern of fibrosis is likely progressivefrom 08/19/2012 most consistent with  usual interstitial pneumonitis (UIP). 5 mm flat nodular density along the right major fissure, likely a subpleural lymph node    Lab Results  Component Value Date   ESRSEDRATE 26* 09/28/2013   ESRSEDRATE 28* 08/03/2013   ESRSEDRATE 43* 06/23/2013     Rheum profile neg ex ANA nucleolar at 1:640      Assessment & Plan:

## 2014-01-27 NOTE — Assessment & Plan Note (Signed)
-  08/03/2013   Walked RA x one lap @ 185 stopped due to  Sob/ sat 88% > improved on 2lpm and completed 3 laps -   09/28/13 Patient Saturations on Room Air at Rest = 99% Patient Saturations on Room Air while Ambulating = 88% Patient Saturations on 2Liters of oxygen while Ambulating = 92%> rx 2lpm with ambulation only  - 12/27/2013  Walked 3lpm non pusled 2 laps @ 185 ft each stopped due to fatigue > sob, no desat    - amb 02 titration rec 01/25/2014

## 2014-01-27 NOTE — Assessment & Plan Note (Signed)
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 5 mm flat nodular density along the right major fissure, likely a subpleural lymph node - 09/28/13 rheum profile neg ex ana Pos nucleolar 1:640> rheum w/u by [place] > rec cellcept but turned down by Medicaid   Nothing else to offer here but 02 monitoring and adjustments

## 2014-01-27 NOTE — Assessment & Plan Note (Signed)
-  Echo 12/22/13 - Ventricular septum: Interventricular septum is D-shaped in systole and diastole, suggesting RV pressure/volume overload. - Aortic valve: There was no stenosis. - Mitral valve: There was mild regurgitation. - Left atrium: The atrium was mildly dilated. - Right ventricle: The cavity size was severely dilated. Systolic function was moderately reduced. - Right atrium: The atrium was moderately dilated. - Tricuspid valve: There was severe regurgitation. Peak RV-RA gradient (S): 102 mm Hg. - Pulmonary arteries: PA peak pressure: 110 mm Hg - Sleep study 10/02/13 only mild osa but desat ? On RA (was not supposed to be off 3lpm so rec repeat ono on 3lpm 12/28/2013  - Venous dopplers neg 12/27/2013 bilaterally  - VQ lung scan 12/28/13 > low prob   So most likely this is PAH related to ILD  > rx ILD per rheum/ rx adequate 02

## 2014-01-30 ENCOUNTER — Encounter (HOSPITAL_COMMUNITY): Payer: Self-pay | Admitting: Emergency Medicine

## 2014-01-30 ENCOUNTER — Inpatient Hospital Stay (HOSPITAL_COMMUNITY)
Admission: EM | Admit: 2014-01-30 | Discharge: 2014-02-03 | DRG: 287 | Disposition: A | Discharge status: Home / Self Care | Payer: Medicare Other | Attending: Internal Medicine | Admitting: Internal Medicine

## 2014-01-30 DIAGNOSIS — J9610 Chronic respiratory failure, unspecified whether with hypoxia or hypercapnia: Secondary | ICD-10-CM | POA: Diagnosis present

## 2014-01-30 DIAGNOSIS — R778 Other specified abnormalities of plasma proteins: Secondary | ICD-10-CM

## 2014-01-30 DIAGNOSIS — J8410 Pulmonary fibrosis, unspecified: Secondary | ICD-10-CM | POA: Diagnosis present

## 2014-01-30 DIAGNOSIS — R9389 Abnormal findings on diagnostic imaging of other specified body structures: Secondary | ICD-10-CM

## 2014-01-30 DIAGNOSIS — N179 Acute kidney failure, unspecified: Secondary | ICD-10-CM | POA: Diagnosis present

## 2014-01-30 DIAGNOSIS — I2789 Other specified pulmonary heart diseases: Secondary | ICD-10-CM | POA: Diagnosis not present

## 2014-01-30 DIAGNOSIS — I5030 Unspecified diastolic (congestive) heart failure: Secondary | ICD-10-CM | POA: Diagnosis present

## 2014-01-30 DIAGNOSIS — E119 Type 2 diabetes mellitus without complications: Secondary | ICD-10-CM | POA: Diagnosis present

## 2014-01-30 DIAGNOSIS — I10 Essential (primary) hypertension: Secondary | ICD-10-CM

## 2014-01-30 DIAGNOSIS — G4719 Other hypersomnia: Secondary | ICD-10-CM

## 2014-01-30 DIAGNOSIS — I50811 Acute right heart failure: Secondary | ICD-10-CM

## 2014-01-30 DIAGNOSIS — N184 Chronic kidney disease, stage 4 (severe): Secondary | ICD-10-CM | POA: Diagnosis present

## 2014-01-30 DIAGNOSIS — R55 Syncope and collapse: Secondary | ICD-10-CM | POA: Diagnosis present

## 2014-01-30 DIAGNOSIS — Z87891 Personal history of nicotine dependence: Secondary | ICD-10-CM

## 2014-01-30 DIAGNOSIS — I272 Pulmonary hypertension, unspecified: Secondary | ICD-10-CM

## 2014-01-30 DIAGNOSIS — D649 Anemia, unspecified: Secondary | ICD-10-CM | POA: Diagnosis present

## 2014-01-30 DIAGNOSIS — E669 Obesity, unspecified: Secondary | ICD-10-CM | POA: Diagnosis present

## 2014-01-30 DIAGNOSIS — J9611 Chronic respiratory failure with hypoxia: Secondary | ICD-10-CM | POA: Diagnosis present

## 2014-01-30 DIAGNOSIS — I509 Heart failure, unspecified: Secondary | ICD-10-CM | POA: Diagnosis present

## 2014-01-30 DIAGNOSIS — Z7982 Long term (current) use of aspirin: Secondary | ICD-10-CM

## 2014-01-30 DIAGNOSIS — R7989 Other specified abnormal findings of blood chemistry: Secondary | ICD-10-CM

## 2014-01-30 DIAGNOSIS — E039 Hypothyroidism, unspecified: Secondary | ICD-10-CM | POA: Diagnosis present

## 2014-01-30 DIAGNOSIS — IMO0001 Reserved for inherently not codable concepts without codable children: Secondary | ICD-10-CM | POA: Diagnosis present

## 2014-01-30 DIAGNOSIS — I129 Hypertensive chronic kidney disease with stage 1 through stage 4 chronic kidney disease, or unspecified chronic kidney disease: Secondary | ICD-10-CM | POA: Diagnosis present

## 2014-01-30 DIAGNOSIS — I4510 Unspecified right bundle-branch block: Secondary | ICD-10-CM | POA: Diagnosis present

## 2014-01-30 LAB — I-STAT CHEM 8, ED
BUN: 17 mg/dL (ref 6–23)
CHLORIDE: 105 meq/L (ref 96–112)
Calcium, Ion: 0.98 mmol/L — ABNORMAL LOW (ref 1.13–1.30)
Creatinine, Ser: 1.7 mg/dL — ABNORMAL HIGH (ref 0.50–1.10)
Glucose, Bld: 138 mg/dL — ABNORMAL HIGH (ref 70–99)
HEMATOCRIT: 47 % — AB (ref 36.0–46.0)
Hemoglobin: 16 g/dL — ABNORMAL HIGH (ref 12.0–15.0)
POTASSIUM: 4.1 meq/L (ref 3.7–5.3)
Sodium: 139 mEq/L (ref 137–147)
TCO2: 25 mmol/L (ref 0–100)

## 2014-01-30 LAB — TSH: TSH: 0.561 u[IU]/mL (ref 0.350–4.500)

## 2014-01-30 LAB — TROPONIN I
Troponin I: 0.48 ng/mL (ref <0.30)
Troponin I: 0.31 ng/mL (ref <0.30)
Troponin I: <0.3 ng/mL (ref <0.30)

## 2014-01-30 LAB — CBC WITH DIFFERENTIAL/PLATELET
Basophils Absolute: 0 10*3/uL (ref 0.0–0.1)
Basophils Relative: 0 % (ref 0–1)
Eosinophils Absolute: 0.2 10*3/uL (ref 0.0–0.7)
Eosinophils Relative: 3 % (ref 0–5)
HCT: 40.1 % (ref 36.0–46.0)
Hemoglobin: 13.1 g/dL (ref 12.0–15.0)
Lymphocytes Relative: 14 % (ref 12–46)
Lymphs Abs: 1.1 10*3/uL (ref 0.7–4.0)
MCH: 28.9 pg (ref 26.0–34.0)
MCHC: 32.7 g/dL (ref 30.0–36.0)
MCV: 88.5 fL (ref 78.0–100.0)
Monocytes Absolute: 0.7 10*3/uL (ref 0.1–1.0)
Monocytes Relative: 9 % (ref 3–12)
Neutro Abs: 5.9 10*3/uL (ref 1.7–7.7)
Neutrophils Relative %: 74 % (ref 43–77)
Platelets: 216 10*3/uL (ref 150–400)
RBC: 4.53 MIL/uL (ref 3.87–5.11)
RDW: 16 % — ABNORMAL HIGH (ref 11.5–15.5)
WBC: 7.9 10*3/uL (ref 4.0–10.5)

## 2014-01-30 LAB — URINALYSIS, ROUTINE W REFLEX MICROSCOPIC
Bilirubin Urine: NEGATIVE
Glucose, UA: NEGATIVE mg/dL
Hgb urine dipstick: NEGATIVE
Ketones, ur: NEGATIVE mg/dL
LEUKOCYTES UA: NEGATIVE
NITRITE: NEGATIVE
PH: 7 (ref 5.0–8.0)
Protein, ur: 100 mg/dL — AB
SPECIFIC GRAVITY, URINE: 1.016 (ref 1.005–1.030)
Urobilinogen, UA: 1 mg/dL (ref 0.0–1.0)

## 2014-01-30 LAB — D-DIMER, QUANTITATIVE (NOT AT ARMC): D DIMER QUANT: 0.37 ug{FEU}/mL (ref 0.00–0.48)

## 2014-01-30 LAB — URINE MICROSCOPIC-ADD ON

## 2014-01-30 LAB — GLUCOSE, CAPILLARY
Glucose-Capillary: 120 mg/dL — ABNORMAL HIGH (ref 70–99)
Glucose-Capillary: 228 mg/dL — ABNORMAL HIGH (ref 70–99)

## 2014-01-30 MED ORDER — HEPARIN (PORCINE) IN NACL 100-0.45 UNIT/ML-% IJ SOLN
1200.0000 [IU]/h | INTRAMUSCULAR | Status: DC
  Administered 2014-01-30: 1000 [IU]/h via INTRAVENOUS
  Filled 2014-01-30 (×2): qty 250

## 2014-01-30 MED ORDER — CARVEDILOL 25 MG PO TABS
25.0000 mg | ORAL_TABLET | Freq: Two times a day (BID) | ORAL | Status: DC
  Administered 2014-01-30 – 2014-01-31 (×3): 25 mg via ORAL
  Filled 2014-01-30 (×5): qty 1

## 2014-01-30 MED ORDER — INSULIN ASPART 100 UNIT/ML ~~LOC~~ SOLN
0.0000 [IU] | Freq: Three times a day (TID) | SUBCUTANEOUS | Status: DC
  Administered 2014-01-31 – 2014-02-01 (×2): 2 [IU] via SUBCUTANEOUS

## 2014-01-30 MED ORDER — ASPIRIN 325 MG PO TABS
325.0000 mg | ORAL_TABLET | Freq: Every day | ORAL | Status: DC
  Administered 2014-01-31 – 2014-02-02 (×3): 325 mg via ORAL
  Filled 2014-01-30 (×5): qty 1

## 2014-01-30 MED ORDER — ASPIRIN 81 MG PO CHEW
324.0000 mg | CHEWABLE_TABLET | Freq: Once | ORAL | Status: AC
  Administered 2014-01-30: 324 mg via ORAL
  Filled 2014-01-30: qty 4

## 2014-01-30 MED ORDER — SIMVASTATIN 20 MG PO TABS
20.0000 mg | ORAL_TABLET | Freq: Every day | ORAL | Status: DC
  Administered 2014-01-30 – 2014-02-02 (×4): 20 mg via ORAL
  Filled 2014-01-30 (×7): qty 1

## 2014-01-30 MED ORDER — ACETAMINOPHEN 650 MG RE SUPP: 650.0000 mg | Freq: Four times a day (QID) | RECTAL | Status: DC | PRN

## 2014-01-30 MED ORDER — SODIUM CHLORIDE 0.9 % IJ SOLN
3.0000 mL | Freq: Two times a day (BID) | INTRAMUSCULAR | Status: DC
  Administered 2014-01-31 – 2014-02-02 (×3): 3 mL via INTRAVENOUS

## 2014-01-30 MED ORDER — LEVOTHYROXINE SODIUM 75 MCG PO TABS
75.0000 ug | ORAL_TABLET | Freq: Every morning | ORAL | Status: DC
  Filled 2014-01-30: qty 1

## 2014-01-30 MED ORDER — ACETAMINOPHEN 325 MG PO TABS: 650.0000 mg | ORAL_TABLET | Freq: Four times a day (QID) | ORAL | Status: DC | PRN

## 2014-01-30 MED ORDER — HEPARIN BOLUS VIA INFUSION
4000.0000 [IU] | Freq: Once | INTRAVENOUS | Status: AC
  Administered 2014-01-30: 4000 [IU] via INTRAVENOUS
  Filled 2014-01-30: qty 4000

## 2014-01-30 MED ORDER — SODIUM CHLORIDE 0.9 % IV SOLN
INTRAVENOUS | Status: DC
  Administered 2014-01-30: 14:00:00 via INTRAVENOUS
  Administered 2014-01-31: 1000 mL via INTRAVENOUS
  Administered 2014-01-31 – 2014-02-01 (×2): via INTRAVENOUS
  Administered 2014-02-02: 1000 mL via INTRAVENOUS

## 2014-01-30 MED ORDER — PANTOPRAZOLE SODIUM 40 MG PO TBEC
40.0000 mg | DELAYED_RELEASE_TABLET | Freq: Every day | ORAL | Status: DC
  Administered 2014-01-30 – 2014-02-03 (×5): 40 mg via ORAL
  Filled 2014-01-30 (×4): qty 1

## 2014-01-30 MED ORDER — INSULIN ASPART 100 UNIT/ML ~~LOC~~ SOLN
0.0000 [IU] | Freq: Every day | SUBCUTANEOUS | Status: DC
  Administered 2014-01-30: 2 [IU] via SUBCUTANEOUS

## 2014-01-30 MED ORDER — ONDANSETRON HCL 4 MG PO TABS: 4.0000 mg | ORAL_TABLET | Freq: Four times a day (QID) | ORAL | Status: DC | PRN

## 2014-01-30 MED ORDER — HEPARIN SODIUM (PORCINE) 5000 UNIT/ML IJ SOLN: 5000.0000 [IU] | Freq: Three times a day (TID) | INTRAMUSCULAR | Status: DC

## 2014-01-30 MED ORDER — ONDANSETRON HCL 4 MG/2ML IJ SOLN: 4.0000 mg | Freq: Four times a day (QID) | INTRAMUSCULAR | Status: DC | PRN

## 2014-01-30 NOTE — Progress Notes (Signed)
ANTICOAGULATION CONSULT NOTE - Initial Consult  Pharmacy Consult for Heparin Indication: chest pain/ACS  No Known Allergies  Patient Measurements: Height: _0  (170.2 cm) Weight: 203 lb 7.8 oz (92.3 kg) IBW/kg (Calculated) : 61.6 Heparin Dosing Weight: 81.6 kg  Vital Signs: Temp: 97.7 F (36.5 C) (09/13 1448) Temp src: Oral (09/13 1448) BP: 133/74 mmHg (09/13 1448) Pulse Rate: 68 (09/13 1448)  Labs:  Recent Labs  01/30/14 1129 01/30/14 1323  HGB 13.1 16.0*  HCT 40.1 47.0*  PLT 216  --   CREATININE  --  1.70*  TROPONINI 0.48*  --     Estimated Creatinine Clearance: 32.3 ml/min (by C-G formula based on Cr of 1.7).   Medical History: Past Medical History  Diagnosis Date  . Hypertension   . Diabetes mellitus   . Diverticulosis   . Anemia   . Hypothyroidism   . Shortness of breath     seeing Dr. Melvyn Novas (pulmonologist)    Assessment: 80 yo female who passed out at home and woke up on the floor without memory of the incident. She denies chest pain or worsening SOB but troponin elevated to 0.48. Pharmacy consulted to start heparin for ACS. Hgb and plt both stable, no s/sx of bleeding. Heparin dosing weight 81.6 kg, will bolus with 60 units/kg and start drip at 12 units/kg/hr. Recheck HL in 8 hours given age > 10.  Goal of Therapy:  Heparin level 0.3-0.7 units/ml Monitor platelets by anticoagulation protocol: Yes   Plan:  Give 4000 units bolus x 1 Start heparin infusion at 1000 units/hr Check anti-Xa level in 8 hours and daily while on heparin Continue to monitor H&H and platelets  Kimi Bordeau E. Maxfield Gildersleeve, Pharm.D Clinical Pharmacy Resident Pager: [phone] 01/30/2014 3:15 PM

## 2014-01-30 NOTE — ED Notes (Signed)
Pt. Eating lunch tray with no problems.

## 2014-01-30 NOTE — ED Notes (Signed)
Pt to department via EMS- pt reports that she was sitting at the computer this morning about 0800 this morning and passed out. Daughter found the patient on the floor. Denies any pain, but reports some nausea. Pt reports that she tried to eat but vomiting afterwards. Right sided heart failure hx. Bp-150/96 Hr-60 Cbg-151

## 2014-01-30 NOTE — ED Notes (Signed)
Pt. States she just went to restroom and will be unable to get a urine sample at this time

## 2014-01-30 NOTE — ED Notes (Signed)
Per lab, BMP needs to be redrawn due to hemolysis of blood. Lab at bedside drawing blood at this time.

## 2014-01-30 NOTE — H&P (Signed)
[facility] History and Physical  Alexa Lawson CBU:[id number] DOB: 1934/02/04 DOA: 01/30/2014  Referring physician: er PCP: Garnet Koyanagi, DO   Chief Complaint: syncope  HPI: Alexa Lawson is a 80 y.o. female  With severe pulm HTN.  She has been in her usual state of health.  She wears 3L O2 24/7.  She was sitting at the computer this AM and then next thing she remembers is waking on the floor.  There were no witnesses.  No bowel or bladder incontinence.  She tried to eat breakfast and had 1 episode of vomiting. Has been able to tolerate lunch since.    Her blood sugar was 151 at the scene by EMS.    She denies chest pain or worsening SOB Patient is completely asymptomatic at this time.    In the ER, her Cr was slightly above baseline 1.3-1.4 (PCP had started her on lasix recently for LE edema- resolved).  Her troponin was elevated as well at 0.48   Review of Systems:  All systems reviewed, negative unless stated above   Past Medical History  Diagnosis Date  . Hypertension   . Diabetes mellitus   . Diverticulosis   . Anemia   . Hypothyroidism   . Shortness of breath     seeing Dr. Melvyn Novas (pulmonologist)   Past Surgical History  Procedure Laterality Date  . Parathyroid sx  2005  . Colonoscopy  10/13/2011    Procedure: COLONOSCOPY;  Surgeon: Juanita Craver, MD;  Location: [facility] END[facility]COPY;  Service: Endoscopy;  Laterality: N/A;  . Abdominal hysterectomy  07/1984  . Cataract surgery with lens implants  1998 and 2000    both eyes  . Parathyroidectomy N/A 08/27/2012    Procedure: PARATHYROIDECTOMY with neck exploration ;  Surgeon: Odis Hollingshead, MD;  Location: [facility] ORS;  Service: General;  Laterality: N/A;   Social History:  reports that she quit smoking about 16 years ago. Her smoking use included Cigarettes. She has a 8.75 pack-year smoking history. She has never used smokeless tobacco. She reports that she does not drink alcohol or use illicit drugs.  No Known  Allergies  Family History  Problem Relation Age of Onset  . Cancer Mother   . Diabetes Maternal Uncle   . Diabetes Maternal Uncle      Prior to Admission medications   Medication Sig Start Date End Date Taking? Authorizing Provider  aspirin 325 MG tablet Take 325 mg by mouth daily.   Yes Historical Provider, MD  carvedilol (COREG) 25 MG tablet Take 25 mg by mouth 2 (two) times daily with a meal.   Yes Historical Provider, MD  furosemide (LASIX) 20 MG tablet Take 20 mg by mouth daily.   Yes Historical Provider, MD  Iron-Vitamin C (VITRON-C) 65-125 MG TABS Take 1 tablet by mouth 2 (two) times daily.    Yes Historical Provider, MD  levothyroxine (SYNTHROID, LEVOTHROID) 75 MCG tablet Take 75 mcg by mouth every morning.    Yes Historical Provider, MD  losartan (COZAAR) 100 MG tablet Take 100 mg by mouth daily.   Yes Historical Provider, MD  metFORMIN (GLUMETZA) 500 MG (MOD) 24 hr tablet Take 500 mg by mouth daily.    Yes Historical Provider, MD  Multiple Vitamins-Minerals (MULTIVITAMIN WITH MINERALS) tablet Take 1 tablet by mouth daily.   Yes Historical Provider, MD  pantoprazole (PROTONIX) 40 MG tablet Take 40 mg by mouth daily.   Yes Historical Provider, MD  simvastatin (ZOCOR) 20 MG tablet Take 20  mg by mouth at bedtime.   Yes Historical Provider, MD  Vitamin D, Ergocalciferol, (DRISDOL) 50000 UNITS CAPS Take 50,000 Units by mouth every 14 (fourteen) days.    Yes Historical Provider, MD   Physical Exam: Filed Vitals:   01/30/14 1215 01/30/14 1245 01/30/14 1315 01/30/14 1330  BP: 150/78 165/79 155/78 142/74  Pulse: 62  92 88  Temp:      TempSrc:      Resp: _0 Height:      Weight:      SpO2: 100% 99% 96% 98%    Wt Readings from Last 3 Encounters:  01/30/14 92.987 kg (205 lb)  01/25/14 92.534 kg (204 lb)  01/21/14 92.987 kg (205 lb)    General:  Appears calm and comfortable- on 3L Eyes: PERRL, normal lids, irises & conjunctiva ENT: grossly normal hearing, lips &  tongue Neck: no LAD, masses or thyromegaly Cardiovascular: RRR, no m/r/g. No LE edema. Respiratory: not moving much air, no wheezing Abdomen: soft, ntnd Skin: no rash or induration seen on limited exam Musculoskeletal: grossly normal tone BUE/BLE Psychiatric: grossly normal mood and affect, speech fluent and appropriate Neurologic: grossly non-focal.          Labs on Admission:  Basic Metabolic Panel:  Recent Labs Lab 01/30/14 1323  NA 139  K 4.1  CL 105  GLUCOSE 138*  BUN 17  CREATININE 1.70*   Liver Function Tests: No results found for this basename: AST, ALT, ALKPHOS, BILITOT, PROT, ALBUMIN,  in the last 168 hours No results found for this basename: LIPASE, AMYLASE,  in the last 168 hours No results found for this basename: AMMONIA,  in the last 168 hours CBC:  Recent Labs Lab 01/30/14 1129 01/30/14 1323  WBC 7.9  --   NEUTROABS 5.9  --   HGB 13.1 16.0*  HCT 40.1 47.0*  MCV 88.5  --   PLT 216  --    Cardiac Enzymes:  Recent Labs Lab 01/30/14 1129  TROPONINI 0.48*    BNP (last 3 results)  Recent Labs  06/23/13 1142 08/03/13 1023  PROBNP 67.0 265.0*   CBG: No results found for this basename: GLUCAP,  in the last 168 hours  Radiological Exams on Admission: No results found.  EKG: Independently reviewed. Unchanged from previous- RBBB  Assessment/Plan Active Problems:   Diabetes mellitus   HTN (hypertension)   Interstitial fibrosis   Chronic respiratory failure with hypoxia   Pulmonary HTN   Syncope   Elevated troponin   Syncope- due to severe Pulm HTN?- cycle CE, tele monitor for arrythmias -check d dimer -may need V/Q vs CTA if elevated  Elevated troponin- will start on heparin, ASA, cycle CE- will d/c heparin if troponin does not increase  AKI on CKD -gentle hydration -hold lasix  Spoke with Dr. Sung Amabile   Code Status: full DVT Prophylaxis: Family Communication: patient Disposition Plan:   Time spent: 75 min  Eulogio Bear [facility] Pager [phone]  **Disclaimer: This note may have been dictated with voice recognition software. Similar sounding words can inadvertently be transcribed and this note may contain transcription errors which may not have been corrected upon publication of note.**

## 2014-01-30 NOTE — ED Provider Notes (Signed)
CSN: [id number]     Arrival date & time 01/30/14  1036 History   First MD Initiated Contact with Patient 01/30/14 1042     Chief Complaint  Patient presents with  . Loss of Consciousness     (Consider location/radiation/quality/duration/timing/severity/associated sxs/prior Treatment) HPI Patient suffered syncopal event while sitting at her computer approximately 8:45 AM today. She had no warning. She is presently asymptomatic. She suffered no injury as a result the event. No treatment prior to coming here. EMS performed CBG which was 151. Presently asymptomatic Past Medical History  Diagnosis Date  . Hypertension   . Diabetes mellitus   . Diverticulosis   . Anemia   . Hypothyroidism   . Shortness of breath     seeing Dr. Melvyn Novas (pulmonologist)   pulmonary hypertension, pulmonary fibrosis Past Surgical History  Procedure Laterality Date  . Parathyroid sx  2005  . Colonoscopy  10/13/2011    Procedure: COLONOSCOPY;  Surgeon: Juanita Craver, MD;  Location: [facility] ENDOSCOPY;  Service: Endoscopy;  Laterality: N/A;  . Abdominal hysterectomy  07/1984  . Cataract surgery with lens implants  1998 and 2000    both eyes  . Parathyroidectomy N/A 08/27/2012    Procedure: PARATHYROIDECTOMY with neck exploration ;  Surgeon: Odis Hollingshead, MD;  Location: [facility] ORS;  Service: General;  Laterality: N/A;   Family History  Problem Relation Age of Onset  . Cancer Mother   . Diabetes Maternal Uncle   . Diabetes Maternal Uncle    History  Substance Use Topics  . Smoking status: Former Smoker -- 0.25 packs/day for 35 years    Types: Cigarettes    Quit date: 03/20/1997  . Smokeless tobacco: Never Used  . Alcohol Use: No   OB History   Grav Para Term Preterm Abortions TAB SAB Ect Mult Living                 Review of Systems  Constitutional: Negative.   HENT: Negative.   Respiratory: Positive for shortness of breath.        Chronic dyspnea with exertion  Cardiovascular: Positive for leg swelling.        Chronic leg edema  Gastrointestinal: Negative.   Musculoskeletal: Negative.   Skin: Negative.   Neurological: Negative.   Psychiatric/Behavioral: Negative.   All other systems reviewed and are negative.     Allergies  Review of patient's allergies indicates no known allergies.  Home Medications   Prior to Admission medications   Medication Sig Start Date End Date Taking? Authorizing Provider  aspirin 325 MG tablet Take 325 mg by mouth daily.    Historical Provider, MD  carvedilol (COREG) 25 MG tablet Take 25 mg by mouth 2 (two) times daily with a meal.    Historical Provider, MD  chlorpheniramine (CHLOR-TRIMETON) 4 MG tablet Take 4 mg by mouth every 4 (four) hours as needed (drippy nose, drainage, throat clearing).    Historical Provider, MD  furosemide (LASIX) 20 MG tablet 1-2 po qd 01/20/14   Rosalita Chessman, DO  Iron-Vitamin C (VITRON-C) 65-125 MG TABS Take 1 tablet by mouth 2 (two) times daily.     Historical Provider, MD  levothyroxine (SYNTHROID, LEVOTHROID) 75 MCG tablet Take 75 mcg by mouth every morning.     Historical Provider, MD  losartan (COZAAR) 100 MG tablet Take 1 tablet (100 mg total) by mouth daily. 12/14/13   Rosalita Chessman, DO  metFORMIN (GLUMETZA) 500 MG (MOD) 24 hr tablet Take 500 mg by mouth  daily with breakfast.    Historical Provider, MD  Multiple Vitamins-Minerals (MULTIVITAMIN WITH MINERALS) tablet Take 1 tablet by mouth daily.    Historical Provider, MD  NONFORMULARY OR COMPOUNDED ITEM Compression socks 20-30 mmhg  Dx edema  1 pair 01/20/14   Rosalita Chessman, DO  NONFORMULARY OR COMPOUNDED ITEM Rolling walker with seat  Dx sob, 01/20/14   Rosalita Chessman, DO  pantoprazole (PROTONIX) 40 MG tablet Take 1 tablet (40 mg total) by mouth daily. Take 30-60 min before first meal of the day 12/14/13   Rosalita Chessman, DO  simvastatin (ZOCOR) 20 MG tablet Take 1 tablet (20 mg total) by mouth at bedtime. 12/22/13   Rosalita Chessman, DO  Vitamin D, Ergocalciferol, (DRISDOL)  50000 UNITS CAPS Take 50,000 Units by mouth every 14 (fourteen) days.     Historical Provider, MD   There were no vitals taken for this visit. Physical Exam  Nursing note and vitals reviewed. Constitutional: She appears well-developed and well-nourished.  HENT:  Head: Normocephalic and atraumatic.  Eyes: Conjunctivae are normal. Pupils are equal, round, and reactive to light.  Neck: Neck supple. No tracheal deviation present. No thyromegaly present.  Cardiovascular: Normal rate and regular rhythm.   No murmur heard. Pulmonary/Chest: Effort normal.  Rales right-sided posteriorly  Abdominal: Soft. Bowel sounds are normal. She exhibits no distension. There is no tenderness.  Musculoskeletal: Normal range of motion. She exhibits no edema and no tenderness.  Neurological: She is alert. Coordination normal.  Skin: Skin is warm and dry. No rash noted.  Psychiatric: She has a normal mood and affect.    ED Course  Procedures (including critical care time) Labs Review Labs Reviewed - No data to display  Imaging Review No results found.   EKG Interpretation   Date/Time:  Sunday January 30 2014 10:43:35 EDT Ventricular Rate:  65 PR Interval:  145 QRS Duration: 148 QT Interval:  451 QTC Calculation: 469 R Axis:   172 Text Interpretation:  Sinus rhythm Probable left atrial enlargement Right  bundle branch block Nonspecific T wave abnormality in anterior leads New  since previous tracing Confirmed by Winfred Leeds  MD, Kerianne Gurr [phone]) on  01/30/2014 11:07:09 AM     1:30 PM patient alert Glasgow Coma Score 15 remains asymptomatic Results for orders placed during the hospital encounter of 01/30/14  TROPONIN I      Result Value Ref Range   Troponin I 0.48 (*) <0.30 ng/mL  CBC WITH DIFFERENTIAL      Result Value Ref Range   WBC 7.9  4.0 - 10.5 K/uL   RBC 4.53  3.87 - 5.11 MIL/uL   Hemoglobin 13.1  12.0 - 15.0 g/dL   HCT 40.1  36.0 - 46.0 %   MCV 88.5  78.0 - 100.0 fL   MCH 28.9  26.0  - 34.0 pg   MCHC 32.7  30.0 - 36.0 g/dL   RDW 16.0 (*) 11.5 - 15.5 %   Platelets 216  150 - 400 K/uL   Neutrophils Relative % 74  43 - 77 %   Neutro Abs 5.9  1.7 - 7.7 K/uL   Lymphocytes Relative 14  12 - 46 %   Lymphs Abs 1.1  0.7 - 4.0 K/uL   Monocytes Relative 9  3 - 12 %   Monocytes Absolute 0.7  0.1 - 1.0 K/uL   Eosinophils Relative 3  0 - 5 %   Eosinophils Absolute 0.2  0.0 - 0.7 K/uL   Basophils  Relative 0  0 - 1 %   Basophils Absolute 0.0  0.0 - 0.1 K/uL  I-STAT CHEM 8, ED      Result Value Ref Range   Sodium 139  137 - 147 mEq/L   Potassium 4.1  3.7 - 5.3 mEq/L   Chloride 105  96 - 112 mEq/L   BUN 17  6 - 23 mg/dL   Creatinine, Ser 1.70 (*) 0.50 - 1.10 mg/dL   Glucose, Bld 138 (*) 70 - 99 mg/dL   Calcium, Ion 0.98 (*) 1.13 - 1.30 mmol/L   TCO2 25  0 - 100 mmol/L   Hemoglobin 16.0 (*) 12.0 - 15.0 g/dL   HCT 47.0 (*) 36.0 - 46.0 %   No results found.  MDM  Spoke with Dr.Vann plan 23 hour observation telemetry Will treat with aspirin in light of elevated troponin and possible NSTEMI Final diagnoses:  None   Dx #1 syncope #2 renal insuffiiciency     Orlie Dakin, MD 01/30/14 1337

## 2014-01-30 NOTE — ED Notes (Signed)
Meal arrived. Family at bedside

## 2014-01-31 DIAGNOSIS — R55 Syncope and collapse: Secondary | ICD-10-CM

## 2014-01-31 DIAGNOSIS — R7989 Other specified abnormal findings of blood chemistry: Secondary | ICD-10-CM

## 2014-01-31 DIAGNOSIS — R0902 Hypoxemia: Secondary | ICD-10-CM

## 2014-01-31 DIAGNOSIS — J8410 Pulmonary fibrosis, unspecified: Secondary | ICD-10-CM

## 2014-01-31 DIAGNOSIS — I2789 Other specified pulmonary heart diseases: Principal | ICD-10-CM

## 2014-01-31 DIAGNOSIS — J9610 Chronic respiratory failure, unspecified whether with hypoxia or hypercapnia: Secondary | ICD-10-CM

## 2014-01-31 LAB — HEPARIN LEVEL (UNFRACTIONATED): Heparin Unfractionated: 0.21 IU/mL — ABNORMAL LOW (ref 0.30–0.70)

## 2014-01-31 LAB — CBC
HCT: 35.4 % — ABNORMAL LOW (ref 36.0–46.0)
HEMOGLOBIN: 11.3 g/dL — AB (ref 12.0–15.0)
MCH: 28.5 pg (ref 26.0–34.0)
MCHC: 31.9 g/dL (ref 30.0–36.0)
MCV: 89.2 fL (ref 78.0–100.0)
Platelets: 216 10*3/uL (ref 150–400)
RBC: 3.97 MIL/uL (ref 3.87–5.11)
RDW: 15.1 % (ref 11.5–15.5)
WBC: 6 10*3/uL (ref 4.0–10.5)

## 2014-01-31 LAB — GLUCOSE, CAPILLARY
GLUCOSE-CAPILLARY: 101 mg/dL — AB (ref 70–99)
GLUCOSE-CAPILLARY: 123 mg/dL — AB (ref 70–99)
GLUCOSE-CAPILLARY: 106 mg/dL — AB (ref 70–99)
Glucose-Capillary: 144 mg/dL — ABNORMAL HIGH (ref 70–99)

## 2014-01-31 LAB — BASIC METABOLIC PANEL
Anion gap: 10 (ref 5–15)
BUN: 20 mg/dL (ref 6–23)
CHLORIDE: 105 meq/L (ref 96–112)
CO2: 26 meq/L (ref 19–32)
Calcium: 7.9 mg/dL — ABNORMAL LOW (ref 8.4–10.5)
Creatinine, Ser: 1.53 mg/dL — ABNORMAL HIGH (ref 0.50–1.10)
GFR calc Af Amer: 37 mL/min — ABNORMAL LOW (ref >90)
GFR, EST NON AFRICAN AMERICAN: 32 mL/min — AB (ref >90)
GLUCOSE: 101 mg/dL — AB (ref 70–99)
POTASSIUM: 4.7 meq/L (ref 3.7–5.3)
Sodium: 141 mEq/L (ref 137–147)

## 2014-01-31 LAB — TROPONIN I: Troponin I: <0.3 ng/mL (ref <0.30)

## 2014-01-31 MED ORDER — LEVOTHYROXINE SODIUM 75 MCG PO TABS
75.0000 ug | ORAL_TABLET | Freq: Every day | ORAL | Status: DC
  Administered 2014-01-31 – 2014-02-03 (×4): 75 ug via ORAL
  Filled 2014-01-31 (×6): qty 1

## 2014-01-31 MED ORDER — SODIUM CHLORIDE 0.9 % IJ SOLN: 3.0000 mL | INTRAMUSCULAR | Status: DC | PRN

## 2014-01-31 MED ORDER — SODIUM CHLORIDE 0.9 % IV SOLN: 250.0000 mL | INTRAVENOUS | Status: DC | PRN

## 2014-01-31 MED ORDER — DIPHENHYDRAMINE-ZINC ACETATE 2-0.1 % EX CREA
TOPICAL_CREAM | Freq: Two times a day (BID) | CUTANEOUS | Status: DC | PRN
  Filled 2014-01-31 (×2): qty 28

## 2014-01-31 MED ORDER — ASPIRIN 81 MG PO CHEW
81.0000 mg | CHEWABLE_TABLET | ORAL | Status: AC
  Administered 2014-02-01: 81 mg via ORAL
  Filled 2014-01-31: qty 1

## 2014-01-31 MED ORDER — SODIUM CHLORIDE 0.9 % IJ SOLN: 3.0000 mL | Freq: Two times a day (BID) | INTRAMUSCULAR | Status: DC

## 2014-01-31 MED ORDER — HEPARIN SODIUM (PORCINE) 5000 UNIT/ML IJ SOLN
5000.0000 [IU] | Freq: Three times a day (TID) | INTRAMUSCULAR | Status: DC
  Administered 2014-01-31 – 2014-02-01 (×3): 5000 [IU] via SUBCUTANEOUS
  Filled 2014-01-31 (×5): qty 1

## 2014-01-31 NOTE — Progress Notes (Signed)
Spoke with Mrs. Mcginnity and her daughter today about her syncopal episode. This is different than her prior episode as there was not warning prior to this. Labs indicate small troponin elevation which is now negative. This does not likely represent ACS, more likely RH strain/hypoxia. Could also be AKI, which is resolving with hydration. Creatinine does not support ability to cath at this point, however, R/LHC may be the best way to make definitive diagnosis. She apparently has advanced severe pulmonary hypertension by echo, but has not yet had RHC. She is not on warfarin. Recent V/Q was negative and LE venous dopplers were also negative. D-dimer is reassuringly negative as well, so doubt PE, but high risk for occurrence in the future.  Will d/w with Dr. Haroldine Laws. May wish to continue to allow renal function to improve and consider catheterization with possible drug-study to see if there is reactive pulmonary hypertension.  Please keep NPO.  Pixie Casino, MD, [facility] Attending Cardiologist [place]

## 2014-01-31 NOTE — Consult Note (Signed)
Advanced Heart Failure Team Consult Note  Reason for Consultation:  Syncope. PAH  HPI:    80 y/o woman with HTN, DM2, ILD and PAH followed by Dr. Melvyn Novas admitted with syncope and elevated troponin.   Says she has been doing fairly well until March at which point she began to develop progressive exercise intolerance. Not struggles with ADLs due to dyspnea and fatigue. Yesterday came out of the shower and sat down to work on her computer and had abrupt syncopal episode and woke up on the floor. No CP or palpitations. No seizure activity. Denies previous syncope but says she gets presyncopal when she bends over to pick things up and then stands back up. Denies edema, orthopnea or PND. Former smoker but quit years ago.   Has been seeing Dr. Melvyn Novas for past year and diagnosed with ILD. CT chest 08/25/13 confirmed. Has had  Sleep study 10/18/13: AHI = 8 VQ 12/28/13: low prob LE dopplers 12/27/13: negative PFTs 05/22/13:      FEV1  1.86 (102%)     FVC    2.15 (91%)     FEF 25-75% 2.64 (169%)     DLCO  46% Echo 12/22/13: LVEF 55%. Septal flattening Grade 1 DD. RV severely dilated with moderate HK. Severe TR. PAP 180mHG   Review of Systems: [y] = yes, _0  = no   General: Weight  gain _1 ; Weight loss _2 ; Anorexia _3 ; Fatigue [Blue.Reese]; Fever _4 ; Chills _5 ; Weakness [ y]  Cardiac: Chest pain/pressure _6 ; Resting SOB _7 ; Exertional SOB [Blue.Reese]; Orthopnea _8 ; Pedal Edema _9 ; Palpitations [Blue.Reese]; Syncope [Blue.Reese]; Presyncope _10 ; Paroxysmal nocturnal dyspnea_11   Pulmonary: Cough [Blue.Reese]; Wheezing_12 ; Hemoptysis_13 ; Sputum _14 ; Snoring _15   GI: Vomiting_16 ; Dysphagia_17 ; Melena_18 ; Hematochezia _19 ; Heartburn_20 ; Abdominal pain _21 ; Constipation _22 ; Diarrhea _23 ; BRBPR _24   GU: Hematuria_25 ; Dysuria _26 ; Nocturia_27   Vascular: Pain in legs with walking _28 ; Pain in feet with lying flat _29 ; Non-healing sores _30 ; Stroke _31 ; TIA _32 ; Slurred speech _33 ;  Neuro: Headaches_34 ; Vertigo_35 ; Seizures_36 ; Paresthesias[  ];Blurred vision _37 ; Diplopia _38 ; Vision changes _39   Ortho/Skin: Arthritis [ y]; Joint pain [Blue.Reese]; Muscle pain _40 ; Joint swelling _41 ; Back Pain _42 ; Rash _43   Psych: Depression_44 ; Anxiety_45   Heme: Bleeding problems _46 ; Clotting disorders _47 ; Anemia _48   Endocrine: Diabetes [ y]; Thyroid dysfunction_49   Home Medications Prior to Admission medications   Medication Sig Start Date End Date Taking? Authorizing Provider  aspirin 325 MG tablet Take 325 mg by mouth daily.   Yes Historical Provider, MD  carvedilol (COREG) 25 MG tablet Take 25 mg by mouth 2 (two) times daily with a meal.   Yes Historical Provider, MD  furosemide (LASIX) 20 MG tablet Take 20 mg by mouth daily.   Yes Historical Provider, MD  Iron-Vitamin C (VITRON-C) 65-125 MG TABS Take 1 tablet by mouth 2 (two) times daily.    Yes Historical Provider, MD  levothyroxine (SYNTHROID, LEVOTHROID) 75 MCG tablet Take 75 mcg by mouth every morning.    Yes Historical Provider, MD  losartan (COZAAR) 100 MG tablet Take 100 mg by mouth daily.   Yes Historical Provider, MD  metFORMIN (GLUMETZA) 500 MG (MOD) 24 hr tablet Take 500 mg by  mouth daily.    Yes Historical Provider, MD  Multiple Vitamins-Minerals (MULTIVITAMIN WITH MINERALS) tablet Take 1 tablet by mouth daily.   Yes Historical Provider, MD  pantoprazole (PROTONIX) 40 MG tablet Take 40 mg by mouth daily.   Yes Historical Provider, MD  simvastatin (ZOCOR) 20 MG tablet Take 20 mg by mouth at bedtime.   Yes Historical Provider, MD  Vitamin D, Ergocalciferol, (DRISDOL) 50000 UNITS CAPS Take 50,000 Units by mouth every 14 (fourteen) days.    Yes Historical Provider, MD    Past Medical History: Past Medical History  Diagnosis Date  . Hypertension   . Diabetes mellitus   . Diverticulosis   . Anemia   . Hypothyroidism   . Shortness of breath     seeing Dr. Melvyn Novas (pulmonologist)    Past Surgical History: Past Surgical History  Procedure Laterality Date  . Parathyroid sx  2005    . Colonoscopy  10/13/2011    Procedure: COLONOSCOPY;  Surgeon: Juanita Craver, MD;  Location: [facility] ENDOSCOPY;  Service: Endoscopy;  Laterality: N/A;  . Abdominal hysterectomy  07/1984  . Cataract surgery with lens implants  1998 and 2000    both eyes  . Parathyroidectomy N/A 08/27/2012    Procedure: PARATHYROIDECTOMY with neck exploration ;  Surgeon: Odis Hollingshead, MD;  Location: [facility] ORS;  Service: General;  Laterality: N/A;    Family History: Family History  Problem Relation Age of Onset  . Cancer Mother   . Diabetes Maternal Uncle   . Diabetes Maternal Uncle     Social History: History   Social History  . Marital Status: Single    Spouse Name: N/A    Number of Children: 1  . Years of Education: N/A   Occupational History  . Retired    Social History Main Topics  . Smoking status: Former Smoker -- 0.25 packs/day for 35 years    Types: Cigarettes    Quit date: 03/20/1997  . Smokeless tobacco: Never Used  . Alcohol Use: No  . Drug Use: No  . Sexual Activity: None   Other Topics Concern  . None   Social History Narrative  . None    Allergies:  No Known Allergies  Objective:    Vital Signs:   Temp:  [97.8 F (36.6 C)-98.1 F (36.7 C)] 97.9 F (36.6 C) (09/14 2100) Pulse Rate:  [64-73] 73 (09/14 2100) Resp:  [18-20] 18 (09/14 2100) BP: (103-155)/(69-80) 155/80 mmHg (09/14 2100) SpO2:  [98 %-100 %] 98 % (09/14 2100) Weight:  [93.441 kg (206 lb)] 93.441 kg (206 lb) (09/14 0500) Last BM Date: 01/31/14  Weight change: Filed Weights   01/30/14 1042 01/30/14 1448 01/31/14 0500  Weight: 92.987 kg (205 lb) 92.3 kg (203 lb 7.8 oz) 93.441 kg (206 lb)    Intake/Output:   Intake/Output Summary (Last 24 hours) at 01/31/14 2354 Last data filed at 01/31/14 2151  Gross per 24 hour  Intake      6 ml  Output      0 ml  Net      6 ml     Physical Exam: General:  Elderly.lying in bed  No resp difficulty HEENT: normal Neck: supple. JVP jaw with prominent CV waves  . Carotids 2+ bilat; no bruits. No lymphadenopathy or thryomegaly appreciated. Cor: PMI nondisplaced. + RV lift. Regular rate & rhythm. 2/6 TR Lungs: minimal crackles Abdomen: soft, nontender, nondistended. No hepatosplenomegaly. No bruits or masses. Good bowel sounds. Extremities: no cyanosis, clubbing, rash, edema Neuro:  alert & orientedx3, cranial nerves grossly intact. moves all 4 extremities w/o difficulty. Affect pleasant  Telemetry: SR  Labs: Basic Metabolic Panel:  Recent Labs Lab 01/30/14 1323 01/31/14 0545  NA 139 141  K 4.1 4.7  CL 105 105  CO2  --  26  GLUCOSE 138* 101*  BUN 17 20  CREATININE 1.70* 1.53*  CALCIUM  --  7.9*    Liver Function Tests: No results found for this basename: AST, ALT, ALKPHOS, BILITOT, PROT, ALBUMIN,  in the last 168 hours No results found for this basename: LIPASE, AMYLASE,  in the last 168 hours No results found for this basename: AMMONIA,  in the last 168 hours  CBC:  Recent Labs Lab 01/30/14 1129 01/30/14 1323 01/31/14 0545  WBC 7.9  --  6.0  NEUTROABS 5.9  --   --   HGB 13.1 16.0* 11.3*  HCT 40.1 47.0* 35.4*  MCV 88.5  --  89.2  PLT 216  --  216    Cardiac Enzymes:  Recent Labs Lab 01/30/14 1129 01/30/14 1545 01/30/14 2048 01/31/14 0545  TROPONINI 0.48* 0.31* <0.30 <0.30    BNP: BNP (last 3 results)  Recent Labs  06/23/13 1142 08/03/13 1023  PROBNP 67.0 265.0*    CBG:  Recent Labs Lab 01/30/14 2005 01/31/14 0744 01/31/14 1155 01/31/14 1612 01/31/14 2041  GLUCAP 228* 101* 123* 106* 144*    Coagulation Studies: No results found for this basename: LABPROT, INR,  in the last 72 hours  Other results: EKG: SR 65 RBBB. RVH/RAE. RV strain  Imaging:  No results found.   Medications:     Current Medications: . [START ON 02/01/2014] aspirin  81 mg Oral Pre-Cath  . aspirin  325 mg Oral Daily  . carvedilol  25 mg Oral BID WC  . heparin subcutaneous  5,000 Units Subcutaneous 3 times per day  .  insulin aspart  0-15 Units Subcutaneous TID WC  . insulin aspart  0-5 Units Subcutaneous QHS  . levothyroxine  75 mcg Oral QAC breakfast  . pantoprazole  40 mg Oral Daily  . simvastatin  20 mg Oral QHS  . sodium chloride  3 mL Intravenous Q12H  . sodium chloride  3 mL Intravenous Q12H     Infusions: . sodium chloride 1,000 mL (01/31/14 1640)      Assessment:   1. Syncope 2. Severe PAH with end-stage cor pulmonale 3. ILD 4. Chronic kidney disease stage III-IV 5. Elevated troponin - suspect due to RV strain 6. Coronary calcifications on CT  Plan/Discussion:     Her syncope is almost certainly due to end-stage cor pulmonale in the setting of severe PAH. On CT scan she does have significant ILD, but based on her PFTs, the degree of PAH seems significantly out of proportion to her ILD - arguing for a component of Who Group I PAH. Thus, I think she may be a candidate for selective pulmonary vasodilators.   I agree with Dr. Lake Bells that she needs a RHC tomorrow. If cardiac output is markedly depressed, I would favor consideration of IV therapy. If CO not severely down would favor inhaled agent.   I have scheduled RHC for 9am tomorrow. Discussed with patient, her daughter and granddaughter in detail. All questions answered.   Would suggest decreasing carvedilol.   Length of Stay: 1  Glori Bickers MD 01/31/2014, 11:54 PM  Advanced Heart Failure Team Pager [phone] (M-F; 7a - 4p)  Please contact [facility] for night-coverage after hours (4p -  7a ) and weekends on [email or web address]

## 2014-01-31 NOTE — Progress Notes (Signed)
ANTICOAGULATION CONSULT NOTE - Follow Up Consult  Pharmacy Consult for heparin Indication: chest pain/ACS  Labs:  Recent Labs  01/30/14 1129 01/30/14 1323 01/30/14 1545 01/30/14 2048 01/31/14 0115  HGB 13.1 16.0*  --   --   --   HCT 40.1 47.0*  --   --   --   PLT 216  --   --   --   --   HEPARINUNFRC  --   --   --   --  0.21*  CREATININE  --  1.70*  --   --   --   TROPONINI 0.48*  --  0.31* <0.30  --     Assessment: 79yo female subtherapeutic on heparin with initial dosing for possible ACS.  Goal of Therapy:  Heparin level 0.3-0.7 units/ml   Plan:  Will increase heparin gtt by 2 units/kg/hr to 1200 units/hr and check level in [place], PharmD, BCPS  01/31/2014,2:48 AM

## 2014-01-31 NOTE — Progress Notes (Signed)
Utilization review completed

## 2014-01-31 NOTE — Progress Notes (Signed)
  I have seen and examined Ms. Engen. I reviewed notes as well as her CT scan, PFTs and echo.   Her syncope is almost certainly due to end-stage cor pulmonale in the setting of severe PAH. On CT scan she does have significant ILD, but based on her PFTs, the degree of PAH seems significantly out of proportion to her ILD - arguing for a component of Who Group I PAH. Thus, I think she may be a candidate for selective pulmonary vasodilators.  I agree with Dr. Lake Bells that she needs a RHC tomorrow. If cardiac output is markedly depressed, I would favor consideration of IV therapy. If CO not severely down would favor inhaled agent.   I have scheduled RHC for 9am tomorrow. All questions answered.   Benay Spice 7:33 PM

## 2014-01-31 NOTE — Progress Notes (Signed)
PROGRESS NOTE  Alexa Lawson [id number]:[id number] DOB: 29-May-1933 DOA: 01/30/2014 PCP: Garnet Koyanagi, DO  Assessment/Plan: Syncope- due to severe Pulm HTN?  -d dimer ok -for RHC? -cards/pulm consult  Elevated troponin-   ASA, cycle CE- trended back to normal  AKI on CKD-baseline 1.3-1.4 recently -gentle hydration  -hold lasix   Code Status: full Family Communication: patient Disposition Plan:    Consultants:  Cards  pulm  Procedures:      HPI/Subjective: No SOB worse than baseline No CP  Objective: Filed Vitals:   01/31/14 0500  BP: 103/78  Pulse: 68  Temp: 97.8 F (36.6 C)  Resp: 20    Intake/Output Summary (Last 24 hours) at 01/31/14 1128 Last data filed at 01/30/14 1700  Gross per 24 hour  Intake    240 ml  Output    300 ml  Net    -60 ml   Filed Weights   01/30/14 1042 01/30/14 1448 01/31/14 0500  Weight: 92.987 kg (205 lb) 92.3 kg (203 lb 7.8 oz) 93.441 kg (206 lb)    Exam:   General:  A+Ox3, NAD  Cardiovascular: rrr  Respiratory: not moving much air  Abdomen: +Bs, soft  Musculoskeletal: no edema   Data Reviewed: Basic Metabolic Panel:  Recent Labs Lab 01/30/14 1323 01/31/14 0545  NA 139 141  K 4.1 4.7  CL 105 105  CO2  --  26  GLUCOSE 138* 101*  BUN 17 20  CREATININE 1.70* 1.53*  CALCIUM  --  7.9*   Liver Function Tests: No results found for this basename: AST, ALT, ALKPHOS, BILITOT, PROT, ALBUMIN,  in the last 168 hours No results found for this basename: LIPASE, AMYLASE,  in the last 168 hours No results found for this basename: AMMONIA,  in the last 168 hours CBC:  Recent Labs Lab 01/30/14 1129 01/30/14 1323 01/31/14 0545  WBC 7.9  --  6.0  NEUTROABS 5.9  --   --   HGB 13.1 16.0* 11.3*  HCT 40.1 47.0* 35.4*  MCV 88.5  --  89.2  PLT 216  --  216   Cardiac Enzymes:  Recent Labs Lab 01/30/14 1129 01/30/14 1545 01/30/14 2048 01/31/14 0545  TROPONINI 0.48* 0.31* <0.30 <0.30   BNP (last 3  results)  Recent Labs  06/23/13 1142 08/03/13 1023  PROBNP 67.0 265.0*   CBG:  Recent Labs Lab 01/30/14 1642 01/30/14 2005 01/31/14 0744  GLUCAP 120* 228* 101*    No results found for this or any previous visit (from the past 240 hour(s)).   Studies: No results found.  Scheduled Meds: . aspirin  325 mg Oral Daily  . carvedilol  25 mg Oral BID WC  . heparin subcutaneous  5,000 Units Subcutaneous 3 times per day  . insulin aspart  0-15 Units Subcutaneous TID WC  . insulin aspart  0-5 Units Subcutaneous QHS  . levothyroxine  75 mcg Oral QAC breakfast  . pantoprazole  40 mg Oral Daily  . simvastatin  20 mg Oral QHS  . sodium chloride  3 mL Intravenous Q12H   Continuous Infusions: . sodium chloride 75 mL/hr at 01/31/14 0754   Antibiotics Given (last 72 hours)   None      Active Problems:   Diabetes mellitus   HTN (hypertension)   Interstitial fibrosis   Chronic respiratory failure with hypoxia   Pulmonary HTN   Syncope   Elevated troponin    Time spent: 35 min    Takeela Peil  [facility]  Pager [phone]. If 7PM-7AM, please contact night-coverage at [email or web address], password [facility] 01/31/2014, 11:28 AM  LOS: 1 day

## 2014-01-31 NOTE — Consult Note (Signed)
Name: Alexa Lawson MRN: [id number] DOB: May 07, 1934    ADMISSION DATE:  01/30/2014 CONSULTATION DATE:  9/14  REFERRING MD :  Eliseo Squires  PRIMARY SERVICE:  [facility]  CHIEF COMPLAINT:  Chronic respiratory failure   BRIEF PATIENT DESCRIPTION:  80 year old female w/ chronic resp failure in setting of ILD (ANA pos, cellcept turned down by medicaid); further complicated by severe PAH (estimated PAS 110 mm Hgb by echo 8/5). Wears O2 at 3 liters baseline. Presents to ER 9/13 after syncopal event.   SIGNIFICANT EVENTS / STUDIES:  PFT's 06/18/2013 VC 2.37 (100%) with no airflow obst and DLCO 46 corrects to 68% -HRCT 08/25/13 >>Pulmonary parenchymal pattern of fibrosis is likely progressivefrom 08/19/2012 most consistent with usual interstitial pneumonitis 09/28/13 rheum profile neg ex ana Pos nucleolar 1:640> rheum w/u by [facility] > rec cellcept but turned down by Medicaid  6/1: PSG, did not have sig respiratory events.  ECHO 8/5: nml LV size, EF 64%, gd I diastolic dysfxn, RV dilated, peak systolic RV fxn reduced, RA dilated. PAP 154mHg 8/10: LE [place] neg for DVT 8/11 NM perfusion: low prob PE    HISTORY OF PRESENT ILLNESS:    80y.o. female followed by MSelinda OrionWith known h/o chronic resp failure in the setting of progressive ILD w/ radiographic pattern c/w UIP (Rheum work-up + ANA, seen [facility].. Recommended Cellcept but turned down by medicaid).   C/b severe pulm HTN (peak PAP estimated by ECHO 110 mmHg). She wears 3L O2 24/7. Per pt, she was in usual state of health until 9/13,  She was sitting at the computer and then next thing she remembers is waking on the floor. There were no witnesses. No bowel or bladder incontinence. She tried to eat breakfast and had 1 episode of vomiting.  Her blood sugar was 151 at the scene by EMS. She denied chest pain or worsening SOB. She was admitted for evaluation of syncope by the IM service w/ working dx syncope in setting of severe PAH. PCCM has been asked to assist  with care in recommending any other pulmonary recommendations.    PAST MEDICAL HISTORY :  Past Medical History  Diagnosis Date  . Hypertension   . Diabetes mellitus   . Diverticulosis   . Anemia   . Hypothyroidism   . Shortness of breath     seeing Dr. WMelvyn Novas(pulmonologist)   Past Surgical History  Procedure Laterality Date  . Parathyroid sx  2005  . Colonoscopy  10/13/2011    Procedure: COLONOSCOPY;  Surgeon: JJuanita Craver MD;  [facility];  Service: Endoscopy;  Laterality: N/A;  . Abdominal hysterectomy  07/1984  . Cataract surgery with lens implants  1998 and 2000    both eyes  . Parathyroidectomy N/A 08/27/2012    Procedure: PARATHYROIDECTOMY with neck exploration ;  Surgeon: TOdis Hollingshead MD;  Location: [facility];  Service: General;  Laterality: N/A;   Prior to Admission medications   Medication Sig Start Date End Date Taking? Authorizing Provider  aspirin 325 MG tablet Take 325 mg by mouth daily.   Yes Historical Provider, MD  carvedilol (COREG) 25 MG tablet Take 25 mg by mouth 2 (two) times daily with a meal.   Yes Historical Provider, MD  furosemide (LASIX) 20 MG tablet Take 20 mg by mouth daily.   Yes Historical Provider, MD  Iron-Vitamin C (VITRON-C) 65-125 MG TABS Take 1 tablet by mouth 2 (two) times daily.    Yes Historical Provider, MD  levothyroxine (SYNTHROID, LEVOTHROID) 75 MCG tablet Take 75 mcg by mouth every morning.    Yes Historical Provider, MD  losartan (COZAAR) 100 MG tablet Take 100 mg by mouth daily.   Yes Historical Provider, MD  metFORMIN (GLUMETZA) 500 MG (MOD) 24 hr tablet Take 500 mg by mouth daily.    Yes Historical Provider, MD  Multiple Vitamins-Minerals (MULTIVITAMIN WITH MINERALS) tablet Take 1 tablet by mouth daily.   Yes Historical Provider, MD  pantoprazole (PROTONIX) 40 MG tablet Take 40 mg by mouth daily.   Yes Historical Provider, MD  simvastatin (ZOCOR) 20 MG tablet Take 20 mg by mouth at bedtime.   Yes Historical Provider, MD    Vitamin D, Ergocalciferol, (DRISDOL) 50000 UNITS CAPS Take 50,000 Units by mouth every 14 (fourteen) days.    Yes Historical Provider, MD   No Known Allergies  FAMILY HISTORY:  Family History  Problem Relation Age of Onset  . Cancer Mother   . Diabetes Maternal Uncle   . Diabetes Maternal Uncle    SOCIAL HISTORY:  reports that she quit smoking about 16 years ago. Her smoking use included Cigarettes. She has a 8.75 pack-year smoking history. She has never used smokeless tobacco. She reports that she does not drink alcohol or use illicit drugs.  REVIEW OF SYSTEMS:   Constitutional: Negative for fever, chills, weight loss, malaise/fatigue and diaphoresis.  HENT: Negative for hearing loss, ear pain, nosebleeds, congestion, sore throat, neck pain, tinnitus and ear discharge.   Eyes: Negative for blurred vision, double vision, photophobia, pain, discharge and redness.  Respiratory: Negative for cough, hemoptysis, sputum production, shortness of breath, wheezing and stridor. At baseline  Cardiovascular: Negative for chest pain, palpitations, orthopnea, claudication, leg swelling and PND.  Gastrointestinal: Negative for heartburn, nausea, vomiting, abdominal pain, diarrhea, constipation, blood in stool and melena.  Genitourinary: Negative for dysuria, urgency, frequency, hematuria and flank pain.  Musculoskeletal: Negative for myalgias, back pain, joint pain and falls.  Skin: Negative for itching and rash.  Neurological: Negative for dizziness, tingling, tremors, sensory change, speech change, focal weakness, seizures, loss of consciousness, weakness and headaches.  Endo/Heme/Allergies: Negative for environmental allergies and polydipsia. Does not bruise/bleed easily.  SUBJECTIVE:  No distress.   VITAL SIGNS: Temp:  [97.7 F (36.5 C)-98.7 F (37.1 C)] 97.8 F (36.6 C) (09/14 0500) Pulse Rate:  [57-92] 68 (09/14 0500) Resp:  [14-25] 20 (09/14 0500) BP: (103-165)/(71-99) 103/78 mmHg  (09/14 0500) SpO2:  [95 %-100 %] 100 % (09/14 0500) Weight:  [92.3 kg (203 lb 7.8 oz)-93.441 kg (206 lb)] 93.441 kg (206 lb) (09/14 0500)  PHYSICAL EXAMINATION: General:  Resting comfortably in no acute distress  Neuro:  Awake, alert, no focal def  HEENT:  [place], no JVD  Cardiovascular:  + gallop  Lungs:  Faint posterior rales  Abdomen:  Soft, non-tender + bowel sounds no OM  Musculoskeletal:  Trace LE edema  Skin: warm, 2 pulses.    Recent Labs Lab 01/30/14 1323 01/31/14 0545  NA 139 141  K 4.1 4.7  CL 105 105  CO2  --  26  BUN 17 20  CREATININE 1.70* 1.53*  GLUCOSE 138* 101*    Recent Labs Lab 01/30/14 1129 01/30/14 1323 01/31/14 0545  HGB 13.1 16.0* 11.3*  HCT 40.1 47.0* 35.4*  WBC 7.9  --  6.0  PLT 216  --  216   No results found.  ASSESSMENT / PLAN:  Chronic respiratory Failure in setting of known ILD. Rheum work up notable for  positive ANA.  Severe secondary PAH Cor Pulmonale  Syncope  Mild elevated Trop I  Diastolic Heart failure gd I CKD (BL scr 1.3- 1.7) DM Obesity   Discussion  Agree w/ cards syncope most likely r/t Right heart strain from underlying svr PAH. Given recent <1 mo ago eval of LE dopplers and VQ scan both being neg thromboembolic event doubtful. It does seem that her PAH is out of proportion to her ILD.   recs Cont supplemental oxygen Get CXR Needs RHC  Further recs s/p right heart cath.. May be a candidate for trial of Revatio?   Attending:  I have seen and examined the patient with nurse practitioner/resident and agree with the note above.   Difficult case.  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  Specifically, if her RHC shows that the RVSP estimate from her echo is accurate, then she likely has a component of pulmonary arteriopathy related to poorly differentiated connective tissue disease.    Her UIP should be treated with cellcept and  prednisone, not IPF anti-fibrotics because she doesn't have IPF.  She has NYHA class IV symptoms, which would mean that she should be considered for flolan.  The problem is that medicare didn't approve her cellcept, so trying to get them to approve pulmonary hypertension medications as well as cellcept will be a challenge.    Her life expectance without treatment of ILD and pulmonary hypertension is less than 6 months.  Explained all this to daughter and patient at length, they are realistic.   Roselie Awkward, MD [facility] Pager: [phone] Cell: [phone] If no response, call [phone]   01/31/2014, 8:39 AM

## 2014-02-01 ENCOUNTER — Encounter (HOSPITAL_COMMUNITY)
Admission: EM | Disposition: A | Discharge status: Home / Self Care | Payer: Self-pay | Source: Home / Self Care | Attending: Internal Medicine

## 2014-02-01 DIAGNOSIS — I10 Essential (primary) hypertension: Secondary | ICD-10-CM

## 2014-02-01 DIAGNOSIS — I279 Pulmonary heart disease, unspecified: Secondary | ICD-10-CM

## 2014-02-01 HISTORY — PX: RIGHT HEART CATHETERIZATION: SHX5447

## 2014-02-01 LAB — CBC
HCT: 36.4 % (ref 36.0–46.0)
Hemoglobin: 11.8 g/dL — ABNORMAL LOW (ref 12.0–15.0)
MCH: 28.3 pg (ref 26.0–34.0)
MCHC: 32.4 g/dL (ref 30.0–36.0)
MCV: 87.3 fL (ref 78.0–100.0)
Platelets: 218 10*3/uL (ref 150–400)
RBC: 4.17 MIL/uL (ref 3.87–5.11)
RDW: 15.1 % (ref 11.5–15.5)
WBC: 6.5 10*3/uL (ref 4.0–10.5)

## 2014-02-01 LAB — BASIC METABOLIC PANEL
Anion gap: 12 (ref 5–15)
BUN: 18 mg/dL (ref 6–23)
CALCIUM: 8.1 mg/dL — AB (ref 8.4–10.5)
CO2: 22 mEq/L (ref 19–32)
Chloride: 107 mEq/L (ref 96–112)
Creatinine, Ser: 1.44 mg/dL — ABNORMAL HIGH (ref 0.50–1.10)
GFR, EST AFRICAN AMERICAN: 39 mL/min — AB (ref >90)
GFR, EST NON AFRICAN AMERICAN: 34 mL/min — AB (ref >90)
Glucose, Bld: 113 mg/dL — ABNORMAL HIGH (ref 70–99)
Potassium: 4.4 mEq/L (ref 3.7–5.3)
SODIUM: 141 meq/L (ref 137–147)

## 2014-02-01 LAB — POCT I-STAT 3, VENOUS BLOOD GAS (G3P V)
ACID-BASE DEFICIT: 1 mmol/L (ref 0.0–2.0)
BICARBONATE: 25.2 meq/L — AB (ref 20.0–24.0)
Bicarbonate: 26.1 mEq/L — ABNORMAL HIGH (ref 20.0–24.0)
O2 SAT: 59 %
O2 Saturation: 59 %
PCO2 VEN: 44.8 mmHg — AB (ref 45.0–50.0)
PCO2 VEN: 46.5 mmHg (ref 45.0–50.0)
PO2 VEN: 32 mmHg (ref 30.0–45.0)
TCO2: 28 mmol/L (ref 0–100)
TCO2: 27 mmol/L (ref 0–100)
pH, Ven: 7.357 — ABNORMAL HIGH (ref 7.250–7.300)
pH, Ven: 7.357 — ABNORMAL HIGH (ref 7.250–7.300)
pO2, Ven: 32 mmHg (ref 30.0–45.0)

## 2014-02-01 LAB — GLUCOSE, CAPILLARY
GLUCOSE-CAPILLARY: 125 mg/dL — AB (ref 70–99)
Glucose-Capillary: 114 mg/dL — ABNORMAL HIGH (ref 70–99)
Glucose-Capillary: 95 mg/dL (ref 70–99)
Glucose-Capillary: 87 mg/dL (ref 70–99)
Glucose-Capillary: 133 mg/dL — ABNORMAL HIGH (ref 70–99)

## 2014-02-01 SURGERY — RIGHT HEART CATH
Anesthesia: LOCAL

## 2014-02-01 MED ORDER — LIDOCAINE HCL (PF) 1 % IJ SOLN
INTRAMUSCULAR | Status: AC
  Filled 2014-02-01: qty 30

## 2014-02-01 MED ORDER — SODIUM CHLORIDE 0.9 % IV SOLN: 250.0000 mL | INTRAVENOUS | Status: DC | PRN

## 2014-02-01 MED ORDER — SODIUM CHLORIDE 0.9 % IJ SOLN: 3.0000 mL | INTRAMUSCULAR | Status: DC | PRN

## 2014-02-01 MED ORDER — HEPARIN SODIUM (PORCINE) 5000 UNIT/ML IJ SOLN
5000.0000 [IU] | Freq: Three times a day (TID) | INTRAMUSCULAR | Status: DC
  Administered 2014-02-01 – 2014-02-03 (×6): 5000 [IU] via SUBCUTANEOUS
  Filled 2014-02-01 (×6): qty 1

## 2014-02-01 MED ORDER — ONDANSETRON HCL 4 MG/2ML IJ SOLN: 4.0000 mg | Freq: Four times a day (QID) | INTRAMUSCULAR | Status: DC | PRN

## 2014-02-01 MED ORDER — CARVEDILOL 6.25 MG PO TABS
6.2500 mg | ORAL_TABLET | Freq: Two times a day (BID) | ORAL | Status: DC
  Administered 2014-02-01 – 2014-02-03 (×5): 6.25 mg via ORAL
  Filled 2014-02-01 (×7): qty 1

## 2014-02-01 MED ORDER — SODIUM CHLORIDE 0.9 % IJ SOLN
3.0000 mL | Freq: Two times a day (BID) | INTRAMUSCULAR | Status: DC
  Administered 2014-02-02: 3 mL via INTRAVENOUS

## 2014-02-01 MED ORDER — ACETAMINOPHEN 325 MG PO TABS: 650.0000 mg | ORAL_TABLET | ORAL | Status: DC | PRN

## 2014-02-01 MED ORDER — HEPARIN (PORCINE) IN NACL 2-0.9 UNIT/ML-% IJ SOLN
INTRAMUSCULAR | Status: AC
  Filled 2014-02-01: qty 1000

## 2014-02-01 NOTE — Progress Notes (Signed)
Site area: rt IJ Site Prior to Removal:  Level 0 Pressure Applied For: 10 minutes Manual:   yes Patient Status During Pull:  stable Post Pull Site:  Level 0 Post Pull Instructions Given:  yes Post Pull Pulses Present: NA Dressing Applied:  yes Bedrest begins @ 0945 Comments: no complications

## 2014-02-01 NOTE — H&P (View-Only) (Signed)
 Advanced Heart Failure Team Consult Note  Reason for Consultation:  Syncope. PAH  HPI:    79 y/o woman with HTN, DM2, ILD and PAH followed by Dr. Wert admitted with syncope and elevated troponin.   Says she has been doing fairly well until March at which point she began to develop progressive exercise intolerance. Not struggles with ADLs due to dyspnea and fatigue. Yesterday came out of the shower and sat down to work on her computer and had abrupt syncopal episode and woke up on the floor. No CP or palpitations. No seizure activity. Denies previous syncope but says she gets presyncopal when she bends over to pick things up and then stands back up. Denies edema, orthopnea or PND. Former smoker but quit years ago.   Has been seeing Dr. Wert for past year and diagnosed with ILD. CT chest 08/25/13 confirmed. Has had  Sleep study 10/18/13: AHI = 8 VQ 12/28/13: low prob LE dopplers 12/27/13: negative PFTs 05/22/13:      FEV1  1.86 (102%)     FVC    2.15 (91%)     FEF 25-75% 2.64 (169%)     DLCO  46% Echo 12/22/13: LVEF 55%. Septal flattening Grade 1 DD. RV severely dilated with moderate HK. Severe TR. PAP 110mmHG   Review of Systems: [y] = yes, [ ] = no   General: Weight  gain [ ]; Weight loss [ ]; Anorexia [ ]; Fatigue [y ]; Fever [ ]; Chills [ ]; Weakness [ y]  Cardiac: Chest pain/pressure [ ]; Resting SOB [ ]; Exertional SOB [y ]; Orthopnea [ ]; Pedal Edema [ ]; Palpitations [y ]; Syncope [y ]; Presyncope [ ]; Paroxysmal nocturnal dyspnea[ ]  Pulmonary: Cough [y ]; Wheezing[ ]; Hemoptysis[ ]; Sputum [ ]; Snoring [ ]  GI: Vomiting[ ]; Dysphagia[ ]; Melena[ ]; Hematochezia [ ]; Heartburn[ ]; Abdominal pain [ ]; Constipation [ ]; Diarrhea [ ]; BRBPR [ ]  GU: Hematuria[ ]; Dysuria [ ]; Nocturia[ ]  Vascular: Pain in legs with walking [ ]; Pain in feet with lying flat [ ]; Non-healing sores [ ]; Stroke [ ]; TIA [ ]; Slurred speech [ ];  Neuro: Headaches[ ]; Vertigo[ ]; Seizures[ ]; Paresthesias[  ];Blurred vision [ ]; Diplopia [ ]; Vision changes [ ]  Ortho/Skin: Arthritis [ y]; Joint pain [y ]; Muscle pain [ ]; Joint swelling [ ]; Back Pain [ ]; Rash [ ]  Psych: Depression[ ]; Anxiety[ ]  Heme: Bleeding problems [ ]; Clotting disorders [ ]; Anemia [ ]  Endocrine: Diabetes [ y]; Thyroid dysfunction[ ]  Home Medications Prior to Admission medications   Medication Sig Start Date End Date Taking? Authorizing Provider  aspirin 325 MG tablet Take 325 mg by mouth daily.   Yes Historical Provider, MD  carvedilol (COREG) 25 MG tablet Take 25 mg by mouth 2 (two) times daily with a meal.   Yes Historical Provider, MD  furosemide (LASIX) 20 MG tablet Take 20 mg by mouth daily.   Yes Historical Provider, MD  Iron-Vitamin C (VITRON-C) 65-125 MG TABS Take 1 tablet by mouth 2 (two) times daily.    Yes Historical Provider, MD  levothyroxine (SYNTHROID, LEVOTHROID) 75 MCG tablet Take 75 mcg by mouth every morning.    Yes Historical Provider, MD  losartan (COZAAR) 100 MG tablet Take 100 mg by mouth daily.   Yes Historical Provider, MD  metFORMIN (GLUMETZA) 500 MG (MOD) 24 hr tablet Take 500 mg by   mouth daily.    Yes Historical Provider, MD  Multiple Vitamins-Minerals (MULTIVITAMIN WITH MINERALS) tablet Take 1 tablet by mouth daily.   Yes Historical Provider, MD  pantoprazole (PROTONIX) 40 MG tablet Take 40 mg by mouth daily.   Yes Historical Provider, MD  simvastatin (ZOCOR) 20 MG tablet Take 20 mg by mouth at bedtime.   Yes Historical Provider, MD  Vitamin D, Ergocalciferol, (DRISDOL) 50000 UNITS CAPS Take 50,000 Units by mouth every 14 (fourteen) days.    Yes Historical Provider, MD    Past Medical History: Past Medical History  Diagnosis Date  . Hypertension   . Diabetes mellitus   . Diverticulosis   . Anemia   . Hypothyroidism   . Shortness of breath     seeing Dr. Wert (pulmonologist)    Past Surgical History: Past Surgical History  Procedure Laterality Date  . Parathyroid sx  2005    . Colonoscopy  10/13/2011    Procedure: COLONOSCOPY;  Surgeon: Jyothi Mann, MD;  Location: [facility];  Service: Endoscopy;  Laterality: N/A;  . Abdominal hysterectomy  07/1984  . Cataract surgery with lens implants  1998 and 2000    both eyes  . Parathyroidectomy N/A 08/27/2012    Procedure: PARATHYROIDECTOMY with neck exploration ;  Surgeon: Todd J Rosenbower, MD;  Location: [facility];  Service: General;  Laterality: N/A;    Family History: Family History  Problem Relation Age of Onset  . Cancer Mother   . Diabetes Maternal Uncle   . Diabetes Maternal Uncle     Social History: History   Social History  . Marital Status: Single    Spouse Name: N/A    Number of Children: 1  . Years of Education: N/A   Occupational History  . Retired    Social History Main Topics  . Smoking status: Former Smoker -- 0.25 packs/day for 35 years    Types: Cigarettes    Quit date: 03/20/1997  . Smokeless tobacco: Never Used  . Alcohol Use: No  . Drug Use: No  . Sexual Activity: None   Other Topics Concern  . None   Social History Narrative  . None    Allergies:  No Known Allergies  Objective:    Vital Signs:   Temp:  [97.8 F (36.6 C)-98.1 F (36.7 C)] 97.9 F (36.6 C) (09/14 2100) Pulse Rate:  [64-73] 73 (09/14 2100) Resp:  [18-20] 18 (09/14 2100) BP: (103-155)/(69-80) 155/80 mmHg (09/14 2100) SpO2:  [98 %-100 %] 98 % (09/14 2100) Weight:  [93.441 kg (206 lb)] 93.441 kg (206 lb) (09/14 0500) Last BM Date: 01/31/14  Weight change: Filed Weights   01/30/14 1042 01/30/14 1448 01/31/14 0500  Weight: 92.987 kg (205 lb) 92.3 kg (203 lb 7.8 oz) 93.441 kg (206 lb)    Intake/Output:   Intake/Output Summary (Last 24 hours) at 01/31/14 2354 Last data filed at 01/31/14 2151  Gross per 24 hour  Intake      6 ml  Output      0 ml  Net      6 ml     Physical Exam: General:  Elderly.lying in bed  No resp difficulty HEENT: normal Neck: supple. JVP jaw with prominent CV waves  . Carotids 2+ bilat; no bruits. No lymphadenopathy or thryomegaly appreciated. Cor: PMI nondisplaced. + RV lift. Regular rate & rhythm. 2/6 TR Lungs: minimal crackles Abdomen: soft, nontender, nondistended. No hepatosplenomegaly. No bruits or masses. Good bowel sounds. Extremities: no cyanosis, clubbing, rash, edema Neuro:   alert & orientedx3, cranial nerves grossly intact. moves all 4 extremities w/o difficulty. Affect pleasant  Telemetry: SR  Labs: Basic Metabolic Panel:  Recent Labs Lab 01/30/14 1323 01/31/14 0545  NA 139 141  K 4.1 4.7  CL 105 105  CO2  --  26  GLUCOSE 138* 101*  BUN 17 20  CREATININE 1.70* 1.53*  CALCIUM  --  7.9*    Liver Function Tests: No results found for this basename: AST, ALT, ALKPHOS, BILITOT, PROT, ALBUMIN,  in the last 168 hours No results found for this basename: LIPASE, AMYLASE,  in the last 168 hours No results found for this basename: AMMONIA,  in the last 168 hours  CBC:  Recent Labs Lab 01/30/14 1129 01/30/14 1323 01/31/14 0545  WBC 7.9  --  6.0  NEUTROABS 5.9  --   --   HGB 13.1 16.0* 11.3*  HCT 40.1 47.0* 35.4*  MCV 88.5  --  89.2  PLT 216  --  216    Cardiac Enzymes:  Recent Labs Lab 01/30/14 1129 01/30/14 1545 01/30/14 2048 01/31/14 0545  TROPONINI 0.48* 0.31* <0.30 <0.30    BNP: BNP (last 3 results)  Recent Labs  06/23/13 1142 08/03/13 1023  PROBNP 67.0 265.0*    CBG:  Recent Labs Lab 01/30/14 2005 01/31/14 0744 01/31/14 1155 01/31/14 1612 01/31/14 2041  GLUCAP 228* 101* 123* 106* 144*    Coagulation Studies: No results found for this basename: LABPROT, INR,  in the last 72 hours  Other results: EKG: SR 65 RBBB. RVH/RAE. RV strain  Imaging:  No results found.   Medications:     Current Medications: . [START ON 02/01/2014] aspirin  81 mg Oral Pre-Cath  . aspirin  325 mg Oral Daily  . carvedilol  25 mg Oral BID WC  . heparin subcutaneous  5,000 Units Subcutaneous 3 times per day  .  insulin aspart  0-15 Units Subcutaneous TID WC  . insulin aspart  0-5 Units Subcutaneous QHS  . levothyroxine  75 mcg Oral QAC breakfast  . pantoprazole  40 mg Oral Daily  . simvastatin  20 mg Oral QHS  . sodium chloride  3 mL Intravenous Q12H  . sodium chloride  3 mL Intravenous Q12H     Infusions: . sodium chloride 1,000 mL (01/31/14 1640)      Assessment:   1. Syncope 2. Severe PAH with end-stage cor pulmonale 3. ILD 4. Chronic kidney disease stage III-IV 5. Elevated troponin - suspect due to RV strain 6. Coronary calcifications on CT  Plan/Discussion:     Her syncope is almost certainly due to end-stage cor pulmonale in the setting of severe PAH. On CT scan she does have significant ILD, but based on her PFTs, the degree of PAH seems significantly out of proportion to her ILD - arguing for a component of Who Group I PAH. Thus, I think she may be a candidate for selective pulmonary vasodilators.   I agree with Dr. McQuaid that she needs a RHC tomorrow. If cardiac output is markedly depressed, I would favor consideration of IV therapy. If CO not severely down would favor inhaled agent.   I have scheduled RHC for 9am tomorrow. Discussed with patient, her daughter and granddaughter in detail. All questions answered.   Would suggest decreasing carvedilol.   Length of Stay: 1  Emilee Market MD 01/31/2014, 11:54 PM  Advanced Heart Failure Team Pager [phone] (M-F; 7a - 4p)  Please contact [facility] for night-coverage after hours (4p -  7a ) and weekends on [email or web address]

## 2014-02-01 NOTE — CV Procedure (Signed)
Cardiac Cath Procedure Note:  Indication:  PAH  Procedures performed:  1) Right heart catheterization  Description of procedure:   The risks and indication of the procedure were explained. Consent was signed and placed on the chart. An appropriate timeout was taken prior to the procedure. The right neck was prepped and draped in the routine sterile fashion and anesthetized with 1% local lidocaine.   A 7 FR venous sheath was placed in the right internal jugular vein using a modified Seldinger technique. A standard Swan-Ganz catheter was used for the procedure.   Complications: None apparent.  Findings:  RA = 12 RV = 94/9/15 PA = 93/28 (53) PCW = 20 Fick cardiac output/index = 4.3/2.1 PVR = 7.6 WU FA sat = 98% PA sat = 59%, 59%  Assessment: 1. Severe PAH with mildly elevated left-sided pressures 2. Preserved cardiac output  Plan/Discussion:  PAH is severe and well out of proportion to ILD suggesting component of WHO Group 1 PAH. Would start inhaled prostanoid like Tyvaso or Ventavis. I will d/w Dr. Kyung Rudd Bensimhon,MD 9:13 AM    Glori Bickers 9:11 AM

## 2014-02-01 NOTE — Interval H&P Note (Signed)
History and Physical Interval Note:  02/01/2014 9:11 AM  Alexa Lawson  has presented today for surgery, with the diagnosis of pulmonary HTN  The various methods of treatment have been discussed with the patient and family. After consideration of risks, benefits and other options for treatment, the patient has consented to  Procedure(s): RIGHT HEART CATH (N/A) as a surgical intervention .  The patient's history has been reviewed, patient examined, no change in status, stable for surgery.  I have reviewed the patient's chart and labs.  Questions were answered to the patient's satisfaction.     Kallyn Demarcus

## 2014-02-01 NOTE — Progress Notes (Signed)
PROGRESS NOTE  Alexa Lawson EOF:[id number] DOB: 01/31/1934 DOA: 01/30/2014 PCP: Garnet Koyanagi, DO  Assessment/Plan: Syncope- due to severe Pulm HTN?  -d dimer ok -for RHC today -cards/pulm consult- ? Transfer to another facility  Elevated troponin-   ASA, cycle CE- trended back to normal  AKI on CKD-baseline 1.3-1.4 recently -gentle hydration  -hold lasix   Code Status: full Family Communication: patient Disposition Plan:    Consultants:  Cards  pulm  Procedures:      HPI/Subjective: No SOB worse than baseline No CP  Objective: Filed Vitals:   02/01/14 0500  BP: 141/64  Pulse: 64  Temp: 97.8 F (36.6 C)  Resp: 18    Intake/Output Summary (Last 24 hours) at 02/01/14 0756 Last data filed at 01/31/14 2151  Gross per 24 hour  Intake      6 ml  Output      0 ml  Net      6 ml   Filed Weights   01/30/14 1042 01/30/14 1448 01/31/14 0500  Weight: 92.987 kg (205 lb) 92.3 kg (203 lb 7.8 oz) 93.441 kg (206 lb)    Exam:   General:  A+Ox3, NAD  Cardiovascular: rrr  Respiratory: decreased, no wheezing  Abdomen: +Bs, soft  Musculoskeletal: no edema   Data Reviewed: Basic Metabolic Panel:  Recent Labs Lab 01/30/14 1323 01/31/14 0545 02/01/14 0555  NA 139 141 141  K 4.1 4.7 4.4  CL 105 105 107  CO2  --  26 22  GLUCOSE 138* 101* 113*  BUN _0 CREATININE 1.70* 1.53* 1.44*  CALCIUM  --  7.9* 8.1*   Liver Function Tests: No results found for this basename: AST, ALT, ALKPHOS, BILITOT, PROT, ALBUMIN,  in the last 168 hours No results found for this basename: LIPASE, AMYLASE,  in the last 168 hours No results found for this basename: AMMONIA,  in the last 168 hours CBC:  Recent Labs Lab 01/30/14 1129 01/30/14 1323 01/31/14 0545 02/01/14 0555  WBC 7.9  --  6.0 6.5  NEUTROABS 5.9  --   --   --   HGB 13.1 16.0* 11.3* 11.8*  HCT 40.1 47.0* 35.4* 36.4  MCV 88.5  --  89.2 87.3  PLT 216  --  216 218   Cardiac  Enzymes:  Recent Labs Lab 01/30/14 1129 01/30/14 1545 01/30/14 2048 01/31/14 0545  TROPONINI 0.48* 0.31* <0.30 <0.30   BNP (last 3 results)  Recent Labs  06/23/13 1142 08/03/13 1023  PROBNP 67.0 265.0*   CBG:  Recent Labs Lab 01/31/14 0744 01/31/14 1155 01/31/14 1612 01/31/14 2041 02/01/14 0725  GLUCAP 101* 123* 106* 144* 114*    No results found for this or any previous visit (from the past 240 hour(s)).   Studies: No results found.  Scheduled Meds: . aspirin  325 mg Oral Daily  . carvedilol  6.25 mg Oral BID WC  . heparin subcutaneous  5,000 Units Subcutaneous 3 times per day  . insulin aspart  0-15 Units Subcutaneous TID WC  . insulin aspart  0-5 Units Subcutaneous QHS  . levothyroxine  75 mcg Oral QAC breakfast  . pantoprazole  40 mg Oral Daily  . simvastatin  20 mg Oral QHS  . sodium chloride  3 mL Intravenous Q12H  . sodium chloride  3 mL Intravenous Q12H   Continuous Infusions: . sodium chloride 1,000 mL (01/31/14 1640)   Antibiotics Given (last 72 hours)   None      Active Problems:  Diabetes mellitus   HTN (hypertension)   Interstitial fibrosis   Chronic respiratory failure with hypoxia   Pulmonary HTN   Syncope   Elevated troponin    Time spent: 25 min    Alexa Lawson  [facility] Pager [phone]. If 7PM-7AM, please contact night-coverage at [email or web address], password [facility] 02/01/2014, 7:56 AM  LOS: 2 days

## 2014-02-02 DIAGNOSIS — R55 Syncope and collapse: Secondary | ICD-10-CM | POA: Diagnosis present

## 2014-02-02 DIAGNOSIS — I129 Hypertensive chronic kidney disease with stage 1 through stage 4 chronic kidney disease, or unspecified chronic kidney disease: Secondary | ICD-10-CM | POA: Diagnosis present

## 2014-02-02 DIAGNOSIS — I509 Heart failure, unspecified: Secondary | ICD-10-CM | POA: Diagnosis present

## 2014-02-02 DIAGNOSIS — I2789 Other specified pulmonary heart diseases: Secondary | ICD-10-CM | POA: Diagnosis present

## 2014-02-02 DIAGNOSIS — N179 Acute kidney failure, unspecified: Secondary | ICD-10-CM | POA: Diagnosis present

## 2014-02-02 DIAGNOSIS — D649 Anemia, unspecified: Secondary | ICD-10-CM | POA: Diagnosis present

## 2014-02-02 DIAGNOSIS — E039 Hypothyroidism, unspecified: Secondary | ICD-10-CM | POA: Diagnosis present

## 2014-02-02 DIAGNOSIS — N184 Chronic kidney disease, stage 4 (severe): Secondary | ICD-10-CM | POA: Diagnosis present

## 2014-02-02 DIAGNOSIS — J9610 Chronic respiratory failure, unspecified whether with hypoxia or hypercapnia: Secondary | ICD-10-CM | POA: Diagnosis present

## 2014-02-02 DIAGNOSIS — J8410 Pulmonary fibrosis, unspecified: Secondary | ICD-10-CM | POA: Diagnosis present

## 2014-02-02 DIAGNOSIS — Z87891 Personal history of nicotine dependence: Secondary | ICD-10-CM | POA: Diagnosis not present

## 2014-02-02 DIAGNOSIS — E669 Obesity, unspecified: Secondary | ICD-10-CM | POA: Diagnosis present

## 2014-02-02 DIAGNOSIS — R9389 Abnormal findings on diagnostic imaging of other specified body structures: Secondary | ICD-10-CM

## 2014-02-02 DIAGNOSIS — E119 Type 2 diabetes mellitus without complications: Secondary | ICD-10-CM | POA: Diagnosis present

## 2014-02-02 DIAGNOSIS — I5030 Unspecified diastolic (congestive) heart failure: Secondary | ICD-10-CM | POA: Diagnosis present

## 2014-02-02 DIAGNOSIS — I4510 Unspecified right bundle-branch block: Secondary | ICD-10-CM | POA: Diagnosis present

## 2014-02-02 DIAGNOSIS — Z7982 Long term (current) use of aspirin: Secondary | ICD-10-CM | POA: Diagnosis not present

## 2014-02-02 LAB — GLUCOSE, CAPILLARY
GLUCOSE-CAPILLARY: 97 mg/dL (ref 70–99)
GLUCOSE-CAPILLARY: 165 mg/dL — AB (ref 70–99)
Glucose-Capillary: 96 mg/dL (ref 70–99)
Glucose-Capillary: 95 mg/dL (ref 70–99)

## 2014-02-02 MED ORDER — POTASSIUM CHLORIDE CRYS ER 20 MEQ PO TBCR
20.0000 meq | EXTENDED_RELEASE_TABLET | Freq: Two times a day (BID) | ORAL | Status: AC
  Administered 2014-02-02 (×2): 20 meq via ORAL
  Filled 2014-02-02 (×2): qty 1

## 2014-02-02 MED ORDER — FUROSEMIDE 10 MG/ML IJ SOLN
40.0000 mg | Freq: Two times a day (BID) | INTRAMUSCULAR | Status: AC
  Administered 2014-02-02 (×2): 40 mg via INTRAVENOUS
  Filled 2014-02-02 (×2): qty 4

## 2014-02-02 MED ORDER — ASPIRIN EC 81 MG PO TBEC
81.0000 mg | DELAYED_RELEASE_TABLET | Freq: Every day | ORAL | Status: DC
  Administered 2014-02-03: 81 mg via ORAL
  Filled 2014-02-02: qty 1

## 2014-02-02 NOTE — Progress Notes (Signed)
Advanced Heart Failure Team Progress Note    HPI:    Feels ok. Weak. Mild dyspnea.    Objective:    Vital Signs:   Temp:  [98.1 F (36.7 C)-98.2 F (36.8 C)] 98.2 F (36.8 C) (09/16 0736) Pulse Rate:  [58-102] 102 (09/16 0736) Resp:  [16-18] 16 (09/16 0736) BP: (128-168)/(70-96) 128/85 mmHg (09/16 0736) SpO2:  [100 %] 100 % (09/16 0736) Weight:  [93.123 kg (205 lb 4.8 oz)] 93.123 kg (205 lb 4.8 oz) (09/16 0400) Last BM Date: 01/31/14  Weight change: Filed Weights   01/30/14 1448 01/31/14 0500 02/02/14 0400  Weight: 92.3 kg (203 lb 7.8 oz) 93.441 kg (206 lb) 93.123 kg (205 lb 4.8 oz)    Intake/Output:  No intake or output data in the 24 hours ending 02/02/14 0945   Physical Exam: General:  Elderly.lying in bed  No resp difficulty HEENT: normal Neck: supple. JVP jaw with prominent CV waves . Carotids 2+ bilat; no bruits. No lymphadenopathy or thryomegaly appreciated. Cor: PMI nondisplaced. + RV lift. Regular rate & rhythm. 2/6 TR Lungs: minimal crackles Abdomen: soft, nontender, nondistended. No hepatosplenomegaly. No bruits or masses. Good bowel sounds. Extremities: no cyanosis, clubbing, rash, edema Neuro: alert & orientedx3, cranial nerves grossly intact. moves all 4 extremities w/o difficulty. Affect pleasant  Telemetry: SR  Labs: Basic Metabolic Panel:  Recent Labs Lab 01/30/14 1323 01/31/14 0545 02/01/14 0555  NA 139 141 141  K 4.1 4.7 4.4  CL 105 105 107  CO2  --  26 22  GLUCOSE 138* 101* 113*  BUN _0 CREATININE 1.70* 1.53* 1.44*  CALCIUM  --  7.9* 8.1*    Liver Function Tests: No results found for this basename: AST, ALT, ALKPHOS, BILITOT, PROT, ALBUMIN,  in the last 168 hours No results found for this basename: LIPASE, AMYLASE,  in the last 168 hours No results found for this basename: AMMONIA,  in the last 168 hours  CBC:  Recent Labs Lab 01/30/14 1129 01/30/14 1323 01/31/14 0545 02/01/14 0555  WBC 7.9  --  6.0 6.5    NEUTROABS 5.9  --   --   --   HGB 13.1 16.0* 11.3* 11.8*  HCT 40.1 47.0* 35.4* 36.4  MCV 88.5  --  89.2 87.3  PLT 216  --  216 218    Cardiac Enzymes:  Recent Labs Lab 01/30/14 1129 01/30/14 1545 01/30/14 2048 01/31/14 0545  TROPONINI 0.48* 0.31* <0.30 <0.30    BNP: BNP (last 3 results)  Recent Labs  06/23/13 1142 08/03/13 1023  PROBNP 67.0 265.0*    CBG:  Recent Labs Lab 02/01/14 0934 02/01/14 1125 02/01/14 1631 02/01/14 2046 02/02/14 0736  GLUCAP 95 87 125* 133* 97    Coagulation Studies: No results found for this basename: LABPROT, INR,  in the last 72 hours  Other results: EKG: SR 65 RBBB. RVH/RAE. RV strain  Imaging: No results found.   Medications:     Current Medications: . aspirin  325 mg Oral Daily  . carvedilol  6.25 mg Oral BID WC  . furosemide  40 mg Intravenous BID  . heparin  5,000 Units Subcutaneous 3 times per day  . insulin aspart  0-15 Units Subcutaneous TID WC  . insulin aspart  0-5 Units Subcutaneous QHS  . levothyroxine  75 mcg Oral QAC breakfast  . pantoprazole  40 mg Oral Daily  . potassium chloride  20 mEq Oral BID  . simvastatin  20 mg Oral QHS  .  sodium chloride  3 mL Intravenous Q12H  . sodium chloride  3 mL Intravenous Q12H    Infusions: . sodium chloride 1,000 mL (02/02/14 0923)     Assessment:   1. Syncope 2. Severe PAH with end-stage cor pulmonale 3. ILD 4. Chronic kidney disease stage III-IV 5. Elevated troponin - suspect due to RV strain 6. Coronary calcifications on CT  Plan/Discussion:      Cath results reviewed with her and her daughter. She has severe PAH out of proportion to her ILD suggesting a component of WHO Group 1 PAH. Will start process to get her Tyvaso.   PCWP also slightly elevated. Will diurese with lasix today as tolerated..   Have PT see.  Hopefully home tomorrow. With f/u with me and Dr. Lake Bells. FYI. Tyvaso application process will likely take a week or two so will not  have it to go home with.   Length of Stay: 3  Glori Bickers MD 02/02/2014, 9:45 AM  Advanced Heart Failure Team Pager [phone] (M-F; [place])  Please contact [facility] for night-coverage after hours (4p -7a ) and weekends on [email or web address]

## 2014-02-02 NOTE — Progress Notes (Signed)
Name: Alexa Lawson MRN: [id number] DOB: 1933-10-18    ADMISSION DATE:  01/30/2014 CONSULTATION DATE:  9/14  REFERRING MD :  Eliseo Squires  PRIMARY SERVICE:  [facility]  CHIEF COMPLAINT:  Chronic respiratory failure   BRIEF PATIENT DESCRIPTION:  80 year old female w/ chronic resp failure in setting of ILD (ANA pos, cellcept turned down by medicaid); further complicated by severe PAH (estimated PAS 110 mm Hgb by echo 8/5). Wears O2 at 3 liters baseline. Presents to ER 9/13 after syncopal event.   SIGNIFICANT EVENTS / STUDIES:  PFT's 06/18/2013 VC 2.37 (100%) with no airflow obst and DLCO 46 corrects to 68% -HRCT 08/25/13 >>Pulmonary parenchymal pattern of fibrosis is likely progressivefrom 08/19/2012 most consistent with usual interstitial pneumonitis 09/28/13 rheum profile neg ex ana Pos nucleolar 1:640> rheum w/u by [facility] > rec cellcept but turned down by Medicaid  6/1: PSG, did not have sig respiratory events.  ECHO 8/5: nml LV size, EF 88%, gd I diastolic dysfxn, RV dilated, peak systolic RV fxn reduced, RA dilated. PAP 160mHg 8/10: LE [place] neg for DVT 8/11 NM perfusion: low prob PE  9/15 RHC>>> SEVERE pulm HTN -- PA = 93/28 (53)  PCW = 20   SUBJECTIVE:  No distress.  Anxious to go home.    VITAL SIGNS: Temp:  [98.1 F (36.7 C)-98.2 F (36.8 C)] 98.2 F (36.8 C) (09/16 0736) Pulse Rate:  [58-102] 102 (09/16 0736) Resp:  [16-18] 16 (09/16 0736) BP: (128-168)/(70-96) 128/85 mmHg (09/16 0736) SpO2:  [100 %] 100 % (09/16 0736) Weight:  [205 lb 4.8 oz (93.123 kg)] 205 lb 4.8 oz (93.123 kg) (09/16 0400)  PHYSICAL EXAMINATION: General:  Resting comfortably in no acute distress  Neuro:  Awake, alert, no focal def  HEENT:  [place], no JVD  Cardiovascular:  + gallop  Lungs:  Faint posterior rales  Abdomen:  Soft, non-tender + bowel sounds no OM  Musculoskeletal:  Trace LE edema  Skin: warm, 2 pulses.    Recent Labs Lab 01/30/14 1323 01/31/14 0545 02/01/14 0555  NA 139 141 141  K  4.1 4.7 4.4  CL 105 105 107  CO2  --  26 22  BUN _0 CREATININE 1.70* 1.53* 1.44*  GLUCOSE 138* 101* 113*    Recent Labs Lab 01/30/14 1129 01/30/14 1323 01/31/14 0545 02/01/14 0555  HGB 13.1 16.0* 11.3* 11.8*  HCT 40.1 47.0* 35.4* 36.4  WBC 7.9  --  6.0 6.5  PLT 216  --  216 218   No results found.  ASSESSMENT / PLAN:  Chronic respiratory Failure in setting of known ILD. Rheum work up notable for positive ANA.  Severe secondary PAH - PA = 93/28  Cor Pulmonale  Syncope - most likely r/t Right heart strain from underlying severe PAH,  Seems out of proportion to the severity of her ILD.  Neg thromboembolic w/u <1 mo ago.  Mild elevated Trop I  Diastolic Heart failure gd I CKD (BL scr 1.3- 1.7) DM Obesity   recs -Cont supplemental oxygen -Cards following - [place] to start tyvaso process  -Her UIP should be treated with cellcept and prednisone - but has had difficulty getting medicare to approve this, and will be difficult to get them to approve pulm htn Rx -She has NYHA class IV symptoms, which would mean that she should be considered for flolan - pt and daughter unsure if they would want to pursue this -will arrange outpt f/u with MCarmelina Paddock  [place], NP 02/02/2014  10:11 AM Pager: [phone] or [phone]   PCCM ATTENDING: I have interviewed and examined the patient and reviewed the database. I have formulated the assessment and plan as reflected in the note above with amendments made by me.  She can be discharged from [facility] perspective. F/U with Dr Lake Bells  Merton Border, MD;  PCCM service; Mobile [phone]

## 2014-02-02 NOTE — Progress Notes (Signed)
[facility] PROGRESS NOTE  Assessment/Plan: Syncope due to Pulmonary HTN: - -d dimer ok - RHS as below - d/c IV fluids, cont lasix per Cardiology.  Chronic respiratory failure with hypoxia Interstitial fibrosis: - Cont home oxygen.  Diabetes mellitus - Good controlled with current symptoms.  HTN (hypertension) - Stable cont current therapy.    DVT Prophylaxis:  Family Communication: patient  Disposition Plan: home in am    Consultants:  Cardiology  pulmonary  Procedures: RA = 12  RV = 94/9/15  PA = 93/28 (53)  PCW = 20  Fick cardiac output/index = 4.3/2.1  PVR = 7.6 WU  FA sat = 98%  PA sat = 59%, 59   Antibiotics:  none  HPI/Subjective: No complain, breathing is imrpove  Objective: Filed Vitals:   02/01/14 2000 02/02/14 0000 02/02/14 0400 02/02/14 0736  BP: 160/95 139/70 139/71 128/85  Pulse: 80 75 58 102  Temp: 98.1 F (36.7 C) 98.1 F (36.7 C)  98.2 F (36.8 C)  TempSrc: Oral Oral Oral Oral  Resp: _0 Height:      Weight:   93.123 kg (205 lb 4.8 oz)   SpO2: 100% 100% 100% 100%   No intake or output data in the 24 hours ending 02/02/14 1057 Filed Weights   01/30/14 1448 01/31/14 0500 02/02/14 0400  Weight: 92.3 kg (203 lb 7.8 oz) 93.441 kg (206 lb) 93.123 kg (205 lb 4.8 oz)    Exam:  General: Alert, awake, oriented x3, in no acute distress.  HEENT: No bruits, no goiter.  Heart: Regular rate and rhythm. Lungs: Good air movement, clear Abdomen: Soft, nontender, nondistended, positive bowel sounds.    Data Reviewed: Basic Metabolic Panel:  Recent Labs Lab 01/30/14 1323 01/31/14 0545 02/01/14 0555  NA 139 141 141  K 4.1 4.7 4.4  CL 105 105 107  CO2  --  26 22  GLUCOSE 138* 101* 113*  BUN _1 CREATININE 1.70* 1.53* 1.44*  CALCIUM  --  7.9* 8.1*   Liver Function Tests: No results found for this basename: AST, ALT, ALKPHOS, BILITOT, PROT, ALBUMIN,  in the last 168 hours No results found for this  basename: LIPASE, AMYLASE,  in the last 168 hours No results found for this basename: AMMONIA,  in the last 168 hours CBC:  Recent Labs Lab 01/30/14 1129 01/30/14 1323 01/31/14 0545 02/01/14 0555  WBC 7.9  --  6.0 6.5  NEUTROABS 5.9  --   --   --   HGB 13.1 16.0* 11.3* 11.8*  HCT 40.1 47.0* 35.4* 36.4  MCV 88.5  --  89.2 87.3  PLT 216  --  216 218   Cardiac Enzymes:  Recent Labs Lab 01/30/14 1129 01/30/14 1545 01/30/14 2048 01/31/14 0545  TROPONINI 0.48* 0.31* <0.30 <0.30   BNP (last 3 results)  Recent Labs  06/23/13 1142 08/03/13 1023  PROBNP 67.0 265.0*   CBG:  Recent Labs Lab 02/01/14 0934 02/01/14 1125 02/01/14 1631 02/01/14 2046 02/02/14 0736  GLUCAP 95 87 125* 133* 97    No results found for this or any previous visit (from the past 240 hour(s)).   Studies: No results found.  Scheduled Meds: . [START ON 02/03/2014] aspirin EC  81 mg Oral Daily  . carvedilol  6.25 mg Oral BID WC  . furosemide  40 mg Intravenous BID  . heparin  5,000 Units Subcutaneous 3 times per day  . insulin aspart  0-15 Units Subcutaneous TID  WC  . insulin aspart  0-5 Units Subcutaneous QHS  . levothyroxine  75 mcg Oral QAC breakfast  . pantoprazole  40 mg Oral Daily  . potassium chloride  20 mEq Oral BID  . simvastatin  20 mg Oral QHS  . sodium chloride  3 mL Intravenous Q12H  . sodium chloride  3 mL Intravenous Q12H   Continuous Infusions: . sodium chloride 1,000 mL (02/02/14 0923)     Charlynne Cousins  [facility] Pager [phone]. If 8PM-8AM, please contact night-coverage at [email or web address], password [facility] 02/02/2014, 10:57 AM  LOS: 3 days

## 2014-02-02 NOTE — Evaluation (Signed)
Physical Therapy Evaluation Patient Details Name: Alexa Lawson MRN: [id number] DOB: September 01, 1933 Today's Date: 02/02/2014   History of Present Illness  Alexa Lawson is a 80 y.o. female With severe pulm HTN.  She has been in her usual state of health.  She wears 3L O2 24/7.  She was sitting at the computer this AM and then next thing she remembers is waking on the floor.  There were no witnesses.  No bowel or bladder incontinence.  She tried to eat breakfast and had 1 episode of vomiting. Has been able to tolerate lunch since.    Her blood sugar was 151 at the scene by EMS.    She denies chest pain or worsening SOB.   Clinical Impression  Pt admitted with syncope. Pt currently with functional limitations due to the deficits listed below (see PT Problem List).  Pt will benefit from skilled PT to increase their independence and safety with mobility to allow discharge to the venue listed below.     Follow Up Recommendations No PT follow up;Other (comment) (recommend cardiac rehab if pt. appropriate)    Equipment Recommendations  Other (comment) (shower seat)    Recommendations for Other Services       Precautions / Restrictions Precautions Precautions: None Restrictions Weight Bearing Restrictions: No      Mobility  Bed Mobility Overal bed mobility: Modified Independent             General bed mobility comments: pt. utilized rail with HOB elevated  Transfers Overall transfer level: Needs assistance Equipment used: None Transfers: Sit to/from Stand Sit to Stand: Supervision            Ambulation/Gait Ambulation/Gait assistance: Min guard Ambulation Distance (Feet): 150 Feet Assistive device: None Gait Pattern/deviations: WFL(Within Functional Limits) Gait velocity: initially WFL however quickly decreased due to fatigue Gait velocity interpretation: Below normal speed for age/gender    Stairs            Wheelchair Mobility    Modified Rankin  (Stroke Patients Only)       Balance Overall balance assessment: Modified Independent                                           Pertinent Vitals/Pain Pain Assessment: No/denies pain    Home Living Family/patient expects to be discharged to:: Private residence Living Arrangements: Children (daughter) Available Help at Discharge: Available 24 hours/day;Family Type of Home: Apartment Home Access: Level entry     Home Layout: One level Home Equipment: Environmental consultant - 4 wheels      Prior Function Level of Independence: Independent               Hand Dominance        Extremity/Trunk Assessment   Upper Extremity Assessment: Overall WFL for tasks assessed           Lower Extremity Assessment: Overall WFL for tasks assessed         Communication   Communication: No difficulties  Cognition Arousal/Alertness: Awake/alert Behavior During Therapy: WFL for tasks assessed/performed Overall Cognitive Status: Within Functional Limits for tasks assessed                      General Comments General comments (skin integrity, edema, etc.): O2 96% after amb on 3 L O2    Exercises  Assessment/Plan    PT Assessment Patient needs continued PT services  PT Diagnosis Difficulty walking   PT Problem List Decreased activity tolerance;Decreased mobility;Decreased knowledge of use of DME;Cardiopulmonary status limiting activity  PT Treatment Interventions DME instruction;Gait training;Functional mobility training;Therapeutic activities;Therapeutic exercise;Patient/family education   PT Goals (Current goals can be found in the Care Plan section) Acute Rehab PT Goals Patient Stated Goal: to go home PT Goal Formulation: With patient/family Time For Goal Achievement: 02/09/14 Potential to Achieve Goals: Good    Frequency Min 3X/week   Barriers to discharge        Co-evaluation               End of Session Equipment Utilized During  Treatment: Gait belt;Oxygen Activity Tolerance: Patient limited by fatigue;Patient tolerated treatment well Patient left: in bed;with call bell/phone within reach;with family/visitor present (dtr, sister in law) Nurse Communication: Mobility status         Time: 9692-4932 PT Time Calculation (min): 26 min   Charges:   PT Evaluation $Initial PT Evaluation Tier I: 1 Procedure PT Treatments $Gait Training: 8-22 mins   PT G Codes:          Siri Cole 02/02/2014, 2:55 PM  Laureen Abrahams, PT, DPT [phone]

## 2014-02-03 DIAGNOSIS — I50811 Acute right heart failure: Secondary | ICD-10-CM | POA: Diagnosis present

## 2014-02-03 DIAGNOSIS — I509 Heart failure, unspecified: Secondary | ICD-10-CM

## 2014-02-03 LAB — GLUCOSE, CAPILLARY: Glucose-Capillary: 104 mg/dL — ABNORMAL HIGH (ref 70–99)

## 2014-02-03 LAB — BASIC METABOLIC PANEL
Anion gap: 13 (ref 5–15)
BUN: 24 mg/dL — AB (ref 6–23)
CO2: 28 meq/L (ref 19–32)
Calcium: 8.4 mg/dL (ref 8.4–10.5)
Chloride: 104 mEq/L (ref 96–112)
Creatinine, Ser: 1.77 mg/dL — ABNORMAL HIGH (ref 0.50–1.10)
GFR calc Af Amer: 31 mL/min — ABNORMAL LOW (ref >90)
GFR, EST NON AFRICAN AMERICAN: 27 mL/min — AB (ref >90)
GLUCOSE: 91 mg/dL (ref 70–99)
POTASSIUM: 4.6 meq/L (ref 3.7–5.3)
SODIUM: 145 meq/L (ref 137–147)

## 2014-02-03 NOTE — Clinical Documentation Improvement (Signed)
Clinical Documentation Clarification #1:   Possible Clinical Conditions?   Chronic Diastolic Congestive Heart Failure Acute Diastolic Congestive Heart Failure Acute Systolic & Diastolic Congestive Heart Failure Acute on Chronic Diastolic Congestive Heart Failure Acute on Chronic Systolic & Diastolic Congestive Heart Failure Other Condition Cannot Clinically Determine   Risk Factors: Diastolic heart failure gd 1, per 9/16 progress notes.   ________________________________________________________________________________________________ Clinical Documentation Clarification #2:   Possible Clinical Conditions?  Acute Cor Pulmonale Acute on Chronic Cor Pulmonale Other Condition Cannot Clinically Determine   Risk Factors: Her syncope is almost certainly due to end-stage cor pulmonale in the setting of severe PAH per 9/15 progress notes.    Thank You, Jeannetta Ellis ,RN Clinical Documentation Specialist:  [phone]  [place] Information Management

## 2014-02-03 NOTE — Progress Notes (Addendum)
Advanced Heart Failure Team Progress Note    HPI:     IV lasix started yesterday. Good diuresis. -3L but weight down only 1 pound. No dizziness. Cr up slightly.       Objective:    Vital Signs:   Temp:  [97.5 F (36.4 C)-98.9 F (37.2 C)] 98 F (36.7 C) (09/17 0723) Pulse Rate:  [61-90] 61 (09/17 0723) Resp:  [16-20] 19 (09/17 0723) BP: (117-156)/(62-114) 150/70 mmHg (09/17 0723) SpO2:  [95 %-100 %] 100 % (09/17 0723) Last BM Date: 01/31/14  Weight change: Filed Weights   01/30/14 1448 01/31/14 0500 02/02/14 0400  Weight: 92.3 kg (203 lb 7.8 oz) 93.441 kg (206 lb) 93.123 kg (205 lb 4.8 oz)    Intake/Output:   Intake/Output Summary (Last 24 hours) at 02/03/14 0745 Last data filed at 02/03/14 0511  Gross per 24 hour  Intake      0 ml  Output   2800 ml  Net  -2800 ml     Physical Exam: General:  Elderly.lying in bed  No resp difficulty HEENT: normal Neck: supple. JVP jaw with prominent CV waves . Carotids 2+ bilat; no bruits. No lymphadenopathy or thryomegaly appreciated. Cor: PMI nondisplaced. + RV lift. Regular rate & rhythm. 2/6 TR Lungs: minimal crackles Abdomen: soft, nontender, nondistended. No hepatosplenomegaly. No bruits or masses. Good bowel sounds. Extremities: no cyanosis, clubbing, rash, edema Neuro: alert & orientedx3, cranial nerves grossly intact. moves all 4 extremities w/o difficulty. Affect pleasant  Telemetry: SR  Labs: Basic Metabolic Panel:  Recent Labs Lab 01/30/14 1323 01/31/14 0545 02/01/14 0555 02/03/14 0540  NA 139 141 141 145  K 4.1 4.7 4.4 4.6  CL 105 105 107 104  CO2  --  _0 GLUCOSE 138* 101* 113* 91  BUN _1 24*  CREATININE 1.70* 1.53* 1.44* 1.77*  CALCIUM  --  7.9* 8.1* 8.4    Liver Function Tests: No results found for this basename: AST, ALT, ALKPHOS, BILITOT, PROT, ALBUMIN,  in the last 168 hours No results found for this basename: LIPASE, AMYLASE,  in the last 168 hours No results found for this  basename: AMMONIA,  in the last 168 hours  CBC:  Recent Labs Lab 01/30/14 1129 01/30/14 1323 01/31/14 0545 02/01/14 0555  WBC 7.9  --  6.0 6.5  NEUTROABS 5.9  --   --   --   HGB 13.1 16.0* 11.3* 11.8*  HCT 40.1 47.0* 35.4* 36.4  MCV 88.5  --  89.2 87.3  PLT 216  --  216 218    Cardiac Enzymes:  Recent Labs Lab 01/30/14 1129 01/30/14 1545 01/30/14 2048 01/31/14 0545  TROPONINI 0.48* 0.31* <0.30 <0.30    BNP: BNP (last 3 results)  Recent Labs  06/23/13 1142 08/03/13 1023  PROBNP 67.0 265.0*    CBG:  Recent Labs Lab 02/02/14 0736 02/02/14 1136 02/02/14 1647 02/02/14 2135 02/03/14 0723  GLUCAP 97 96 95 165* 104*    Coagulation Studies: No results found for this basename: LABPROT, INR,  in the last 72 hours  Other results:   Imaging: No results found.   Medications:     Current Medications: . aspirin EC  81 mg Oral Daily  . carvedilol  6.25 mg Oral BID WC  . heparin  5,000 Units Subcutaneous 3 times per day  . insulin aspart  0-15 Units Subcutaneous TID WC  . insulin aspart  0-5 Units Subcutaneous QHS  . levothyroxine  75 mcg  Oral QAC breakfast  . pantoprazole  40 mg Oral Daily  . simvastatin  20 mg Oral QHS  . sodium chloride  3 mL Intravenous Q12H  . sodium chloride  3 mL Intravenous Q12H    Infusions:     Assessment:   1. Syncope 2. Severe PAH with end-stage cor pulmonale 3. ILD 4. Chronic kidney disease stage III-IV 5. Elevated troponin - suspect due to RV strain 6. Coronary calcifications on CT  Plan/Discussion:      She has severe PAH out of proportion to her ILD suggesting a component of WHO Group 1 PAH. We have started process to get her Tyvaso. Hopefully with have in 10-14 days. With preserved cardiac output and some underlying lung disease would start with inhaled prostanoid (Tyvaso) over IV therapy at this point. Can add additional combination therapy in next few weeks if she tolerates Tyvaso.  Renal function worse  with additional diuresis. Would continue home dose of lasix 20 daily. I have cut carvedilol down to 6.25 bid in setting of lung disease and syncope. If needs more BP control would favor amlodipine.   Will arrange f/u with me in HF/PAH Clinic next week.   She will also need f/u with Dr. Lake Bells for further treatment of ILD.   Recommend pulmonary rehab referral.    Length of Stay: 4  Glori Bickers MD 02/03/2014, 7:45 AM  Advanced Heart Failure Team Pager [phone] (M-F; [place])  Please contact [facility] for night-coverage after hours (4p -7a ) and weekends on [email or web address]

## 2014-02-03 NOTE — Care Management Note (Signed)
    Page 1 of 1   02/03/2014     11:19:01 AM CARE MANAGEMENT NOTE 02/03/2014  Patient:  Alexa Lawson, Alexa Lawson   Account Number:  [id number]  Date Initiated:  02/03/2014  Documentation initiated by:  GRAVES-BIGELOW,Ella Golomb  Subjective/Objective Assessment:   Pt admitted for syncope & Acute right-sided heart failure. Plan for d/c home today.     Action/Plan:   No needs from CM at this time.   Anticipated DC Date:  02/03/2014   Anticipated DC Plan:  [place]  CM consult      Choice offered to / List presented to:             Status of service:  Completed, signed off Medicare Important Message given?  YES (If response is "NO", the following Medicare IM given date fields will be blank) Date Medicare IM given:  02/03/2014 Medicare IM given by:  GRAVES-BIGELOW,Nicole Hafley Date Additional Medicare IM given:   Additional Medicare IM given by:    Discharge Disposition:  HOME/SELF CARE  Per UR Regulation:  Reviewed for med. necessity/level of care/duration of stay  If discussed at [place] of Stay Meetings, dates discussed:    Comments:

## 2014-02-03 NOTE — Discharge Summary (Signed)
Physician Discharge Summary  Alexa Lawson QIW:[id number] DOB: [birth year] DOA: 01/30/2014  PCP: Garnet Koyanagi, DO  Admit date: 01/30/2014 Discharge date: 02/03/2014  Time spent: 16mnutes  Recommendations for Outpatient Follow-up:  1. Follow up with the heart  Failure clinic in 2 week. 2. Follow up with Dr. WMelvyn Novason 9.30.2015 BNP    Component Value Date/Time   PROBNP 265.0* 08/03/2013 1023   Filed Weights   01/30/14 1448 01/31/14 0500 02/02/14 0400  Weight: 92.3 kg (203 lb 7.8 oz) 93.441 kg (206 lb) 93.123 kg (205 lb 4.8 oz)     Discharge Diagnoses:  Principal Problem:   Acute right-sided heart failure Active Problems:   Diabetes mellitus   HTN (hypertension)   Interstitial fibrosis   Chronic respiratory failure with hypoxia   Pulmonary HTN   Syncope   Elevated troponin   Heart failure   Discharge Condition: stable  Diet recommendation: low sodium diet    History of present illness:  80y.o. female With severe pulm HTN. She has been in her usual state of health. She wears 3L O2 24/7. She was sitting at the computer on the morning of admission and then next thing she remembers is waking on the floor. There were no witnesses. No bowel or bladder incontinence. She tried to eat breakfast and had 1 episode of vomiting. Has been able to tolerate lunch since.    Hospital Course:  Syncope due to Pulmonary HTN with right sided heart failure:  - D-dimer negative, cardiology consulted recommended RHC with results as below. Suggestive of WHO group 1 PAH. Pulmonary consulted. Recommended cellcept and prednisone but due to difficulty with medicare to approve. Follow up with Dr. MLake Bells - started on IV lasix with good diuresis. - Moderate UOP. Estimated dry weight as above. - to start inhaled prostanoid in 2-3 weeks.  Chronic respiratory failure with hypoxia Interstitial fibrosis:  - Cont home oxygen.   Diabetes mellitus  - Good controlled with current symptoms.   HTN  (hypertension)  - Stable cont current therapy.  Procedures: RHC: RA = 12  RV = 94/9/15  PA = 93/28 (53)  PCW = 20  Fick cardiac output/index = 4.3/2.1  PVR = 7.6 WU  FA sat = 98%  PA sat = 59%, 59   Consultations:  Cardiology  pulmonary  Discharge Exam: Filed Vitals:   02/03/14 0723  BP: 150/70  Pulse: 61  Temp: 98 F (36.7 C)  Resp: 19    General: A&O x3 Cardiovascular: RRR Respiratory: good air movement CTA B/L  Discharge Instructions      Discharge Instructions   Diet - low sodium heart healthy    Complete by:  As directed      Diet - low sodium heart healthy    Complete by:  As directed      Increase activity slowly    Complete by:  As directed      Increase activity slowly    Complete by:  As directed             Medication List         aspirin 325 MG tablet  Take 325 mg by mouth daily.     carvedilol 25 MG tablet  Commonly known as:  COREG  Take 25 mg by mouth 2 (two) times daily with a meal.     furosemide 20 MG tablet  Commonly known as:  LASIX  Take 20 mg by mouth daily.     levothyroxine 75  MCG tablet  Commonly known as:  SYNTHROID, LEVOTHROID  Take 75 mcg by mouth every morning.     losartan 100 MG tablet  Commonly known as:  COZAAR  Take 100 mg by mouth daily.     metFORMIN 500 MG (MOD) 24 hr tablet  Commonly known as:  GLUMETZA  Take 500 mg by mouth daily.     multivitamin with minerals tablet  Take 1 tablet by mouth daily.     pantoprazole 40 MG tablet  Commonly known as:  PROTONIX  Take 40 mg by mouth daily.     simvastatin 20 MG tablet  Commonly known as:  ZOCOR  Take 20 mg by mouth at bedtime.     Vitamin D (Ergocalciferol) 50000 UNITS Caps capsule  Commonly known as:  DRISDOL  Take 50,000 Units by mouth every 14 (fourteen) days.     VITRON-C 65-125 MG Tabs  Generic drug:  Iron-Vitamin C  Take 1 tablet by mouth 2 (two) times daily.       No Known Allergies Follow-up Information   Follow up with  PARRETT,TAMMY, NP On 02/16/2014. (9:45am - Dr. Gustavus Bryant NP)    Specialty:  Nurse Practitioner   Contact information:   [place]. [place] [address] [phone]       Follow up with [facility] In 2 weeks. (hospital follow up)    Specialty:  Cardiology   Contact information:   [address] [id number] Danice Goltz [place] [address] [phone]       The results of significant diagnostics from this hospitalization (including imaging, microbiology, ancillary and laboratory) are listed below for reference.    Significant Diagnostic Studies: No results found.  Microbiology: No results found for this or any previous visit (from the past 240 hour(s)).   Labs: Basic Metabolic Panel:  Recent Labs Lab 01/30/14 1323 01/31/14 0545 02/01/14 0555 02/03/14 0540  NA 139 141 141 145  K 4.1 4.7 4.4 4.6  CL 105 105 107 104  CO2  --  _0 GLUCOSE 138* 101* 113* 91  BUN _1 24*  CREATININE 1.70* 1.53* 1.44* 1.77*  CALCIUM  --  7.9* 8.1* 8.4   Liver Function Tests: No results found for this basename: AST, ALT, ALKPHOS, BILITOT, PROT, ALBUMIN,  in the last 168 hours No results found for this basename: LIPASE, AMYLASE,  in the last 168 hours No results found for this basename: AMMONIA,  in the last 168 hours CBC:  Recent Labs Lab 01/30/14 1129 01/30/14 1323 01/31/14 0545 02/01/14 0555  WBC 7.9  --  6.0 6.5  NEUTROABS 5.9  --   --   --   HGB 13.1 16.0* 11.3* 11.8*  HCT 40.1 47.0* 35.4* 36.4  MCV 88.5  --  89.2 87.3  PLT 216  --  216 218   Cardiac Enzymes:  Recent Labs Lab 01/30/14 1129 01/30/14 1545 01/30/14 2048 01/31/14 0545  TROPONINI 0.48* 0.31* <0.30 <0.30   BNP: BNP (last 3 results)  Recent Labs  06/23/13 1142 08/03/13 1023  PROBNP 67.0 265.0*   CBG:  Recent Labs Lab 02/02/14 0736 02/02/14 1136 02/02/14 1647 02/02/14 2135 02/03/14 0723  GLUCAP 97 96 95 165* 104*        Signed:  Westwood, Fawzi Melman  [facility] 02/03/2014, 9:39 AM

## 2014-02-09 ENCOUNTER — Encounter (HOSPITAL_COMMUNITY): Payer: Self-pay

## 2014-02-09 ENCOUNTER — Ambulatory Visit (HOSPITAL_COMMUNITY)
Admit: 2014-02-09 | Discharge: 2014-02-09 | Disposition: A | Discharge status: Home / Self Care | Payer: Medicare Other | Source: Ambulatory Visit | Attending: Internal Medicine | Admitting: Internal Medicine

## 2014-02-09 VITALS — BP 110/58 | HR 91 | Wt 203.0 lb

## 2014-02-09 DIAGNOSIS — Z7982 Long term (current) use of aspirin: Secondary | ICD-10-CM | POA: Insufficient documentation

## 2014-02-09 DIAGNOSIS — N183 Chronic kidney disease, stage 3 unspecified: Secondary | ICD-10-CM | POA: Diagnosis not present

## 2014-02-09 DIAGNOSIS — J8410 Pulmonary fibrosis, unspecified: Secondary | ICD-10-CM | POA: Insufficient documentation

## 2014-02-09 DIAGNOSIS — E039 Hypothyroidism, unspecified: Secondary | ICD-10-CM | POA: Insufficient documentation

## 2014-02-09 DIAGNOSIS — Z9981 Dependence on supplemental oxygen: Secondary | ICD-10-CM | POA: Diagnosis not present

## 2014-02-09 DIAGNOSIS — I2789 Other specified pulmonary heart diseases: Secondary | ICD-10-CM | POA: Diagnosis not present

## 2014-02-09 DIAGNOSIS — E119 Type 2 diabetes mellitus without complications: Secondary | ICD-10-CM | POA: Insufficient documentation

## 2014-02-09 DIAGNOSIS — I129 Hypertensive chronic kidney disease with stage 1 through stage 4 chronic kidney disease, or unspecified chronic kidney disease: Secondary | ICD-10-CM | POA: Diagnosis not present

## 2014-02-09 DIAGNOSIS — Z79899 Other long term (current) drug therapy: Secondary | ICD-10-CM | POA: Insufficient documentation

## 2014-02-09 DIAGNOSIS — I272 Pulmonary hypertension, unspecified: Secondary | ICD-10-CM

## 2014-02-09 NOTE — Patient Instructions (Signed)
Your physician recommends that you schedule a follow-up appointment in: 1 month  

## 2014-02-09 NOTE — Progress Notes (Signed)
Patient ID: Alexa Lawson, female   DOB: [birth year], 80 y.o.   MRN: [id number]   HPI: Alexa Lawson is a 80 y/o woman with HTN, DM2, ILD and PAH.  Recently admitted (9/15) with syncope. Had [place] with severe PAP out of proportion ILD suggestive of WHO Group I PAH. Recommendations to start Tyvaso. Application is under way for Tyvaso. Discharge weight 205 pounds.   She returns for post hospital follow up. Feels ok. No further syncope. Weight stable. Wearing home O2. SOB walking to car ~100 feet. No edema.   Sleep study 10/18/13: AHI = 8  VQ 12/28/13: low prob  LE dopplers 12/27/13: negative  PFTs 05/22/13:  FEV1 1.86 (102%)  FVC 2.15 (91%)  FEF 25-75% 2.64 (169%)  DLCO 46%  Echo 12/22/13: LVEF 55%. Septal flattening Grade 1 DD. RV severely dilated with moderate HK. Severe TR. PAP 140mHG   RHC 02/01/14  RA = 12  RV = 94/9/15  PA = 93/28 (53)  PCW = 20  Fick cardiac output/index = 4.3/2.1  PVR = 7.6 WU  FA sat = 98%  PA sat = 59%, 59%      ROS: All systems negative except as listed in HPI, PMH and Problem List.  Past Medical History  Diagnosis Date  . Hypertension   . Diabetes mellitus   . Diverticulosis   . Anemia   . Hypothyroidism   . Shortness of breath     seeing Dr. WMelvyn Novas(pulmonologist)    Current Outpatient Prescriptions  Medication Sig Dispense Refill  . aspirin 325 MG tablet Take 325 mg by mouth daily.      . carvedilol (COREG) 25 MG tablet Take 25 mg by mouth 2 (two) times daily with a meal.      . furosemide (LASIX) 20 MG tablet Take 20 mg by mouth daily.      . Iron-Vitamin C (VITRON-C) 65-125 MG TABS Take 1 tablet by mouth 2 (two) times daily.       .Marland Kitchenlevothyroxine (SYNTHROID, LEVOTHROID) 75 MCG tablet Take 75 mcg by mouth every morning.       .Marland Kitchenlosartan (COZAAR) 100 MG tablet Take 100 mg by mouth daily.      . metFORMIN (GLUMETZA) 500 MG (MOD) 24 hr tablet Take 500 mg by mouth daily.       . Multiple Vitamins-Minerals (MULTIVITAMIN WITH MINERALS) tablet Take  1 tablet by mouth daily.      . pantoprazole (PROTONIX) 40 MG tablet Take 40 mg by mouth daily.      . simvastatin (ZOCOR) 20 MG tablet Take 20 mg by mouth at bedtime.      . Vitamin D, Ergocalciferol, (DRISDOL) 50000 UNITS CAPS Take 50,000 Units by mouth every 14 (fourteen) days.        No current facility-administered medications for this encounter.     PHYSICAL EXAM: Filed Vitals:   02/09/14 1150  BP: 110/58  Pulse: 91  Weight: 203 lb (92.08 kg)  SpO2: 91%    Physical Exam:  General: Sitting in chair No resp difficulty  On O2 HEENT: normal  Neck: supple. JVP 6-7. Carotids 2+ bilat; no bruits. No lymphadenopathy or thryomegaly appreciated.  Cor: PMI nondisplaced. + RV lift. Regular rate & rhythm. 2/6 TR  Lungs: minimal crackles  Abdomen: soft, nontender, nondistended. No hepatosplenomegaly. No bruits or masses. Good bowel sounds.  Extremities: no cyanosis, clubbing, rash, edema  Neuro: alert & orientedx3, cranial nerves grossly intact. moves all 4 extremities w/o difficulty. Affect  pleasant  ASSESSMENT & PLAN:  1. . Severe PAH with end-stage cor pulmonale  2.  ILD - Per Dr [place] Bells. On home o2 3. Chronic kidney disease stage III-IV (Cr 1.5-1.7) 4. Coronary calcifications on CT  Overall stable. Awaiting Tyvaso. Will likely need combination therapy in near future. Volume status improved. Will see back in 6 weeks.   [name],MD 1:00 PM

## 2014-02-15 ENCOUNTER — Telehealth (HOSPITAL_COMMUNITY): Payer: Self-pay | Admitting: Vascular Surgery

## 2014-02-15 NOTE — Telephone Encounter (Signed)
Pt was d/c 10 days ago weight 199.. Today weight 205 pt daughter states she is taking her Lasix.Marland Kitchen Please advise

## 2014-02-16 ENCOUNTER — Ambulatory Visit (INDEPENDENT_AMBULATORY_CARE_PROVIDER_SITE_OTHER): Payer: Medicare Other | Admitting: Adult Health

## 2014-02-16 ENCOUNTER — Encounter: Payer: Self-pay | Admitting: Adult Health

## 2014-02-16 VITALS — BP 114/66 | HR 95 | Temp 97.9°F | Ht 67.0 in | Wt 205.2 lb

## 2014-02-16 DIAGNOSIS — J9610 Chronic respiratory failure, unspecified whether with hypoxia or hypercapnia: Secondary | ICD-10-CM

## 2014-02-16 DIAGNOSIS — IMO0001 Reserved for inherently not codable concepts without codable children: Secondary | ICD-10-CM

## 2014-02-16 DIAGNOSIS — R0902 Hypoxemia: Secondary | ICD-10-CM

## 2014-02-16 DIAGNOSIS — I272 Pulmonary hypertension, unspecified: Secondary | ICD-10-CM

## 2014-02-16 DIAGNOSIS — I2789 Other specified pulmonary heart diseases: Secondary | ICD-10-CM

## 2014-02-16 DIAGNOSIS — I50811 Acute right heart failure: Secondary | ICD-10-CM

## 2014-02-16 DIAGNOSIS — J9611 Chronic respiratory failure with hypoxia: Secondary | ICD-10-CM

## 2014-02-16 DIAGNOSIS — J8410 Pulmonary fibrosis, unspecified: Secondary | ICD-10-CM

## 2014-02-16 DIAGNOSIS — I509 Heart failure, unspecified: Secondary | ICD-10-CM

## 2014-02-16 NOTE — Assessment & Plan Note (Signed)
Cellcept recommmended but Medicaid denied coverage  Cont to follow  Cont w/ O2 .

## 2014-02-16 NOTE — Assessment & Plan Note (Signed)
Cont follow up with Pulmonary HTN clinic  Tyvaso is planned awaiting insurance approval.  Cont on O2

## 2014-02-16 NOTE — Assessment & Plan Note (Signed)
Secondary to severe PAH Cont on lasix  Low salt diet  O2 at 3l/m to keep sats >88%-90%

## 2014-02-16 NOTE — Telephone Encounter (Signed)
Patient has hosp f/u apt today, will wait for eval at this appointment before further intervention.

## 2014-02-16 NOTE — Progress Notes (Signed)
Subjective:    Patient ID: Alexa Lawson, female    DOB: [birth year]  MRN: [id number]    Brief patient profile:  17 yobf quit smoking 1998  with no resp problems until  1990's cough ? From ACEi then 2006 bad pneumonia in ICU Dr Owens Shark in [facility] 100% improved then  onset   cough and sob onset 2013 referred to pulmonary clinic 09/02/2012 by Dr Barkley Bruns with abn cxr periop for parathyroidectomy    History of Present Illness  09/02/2012 1st pulmonary eval cc indolent onset sob with dry hacking cough x 8-12 months   varied some with wt gain house work and gets sob if does more than slow pace at basline Has year round nose stuffy and runs year round clear x 15 y rec Wt loss, gerd diet   10/20/2012 f/u ov/Wert re ? ILD Chief Complaint  Patient presents with  . Follow-up    Breathing doing the same. No new co's today.   not really limited by breathing from doing desired activities. Main complaint is chronic dry hacking daytime cough rec Pantoprazole (protonix) 40 mg   Take 30-60 min before first meal of the day and Pepcid 20 mg one bedtime until return to office >  Did not follow instructions because "the meds ran out" - I thought you wanted me to stop them " GERD diet   01/20/2013 f/u ov/Wert re ? ILD  Chief Complaint  Patient presents with  . Follow-up    Pt states breathing is unchanged and denies any new co's. Cough has improved some.  not limited by breathing from desired activities including "chasing my grandchildren around" Cough is dry and mostly with talking some better No h/o macrodantin exp, no h/o connective tissue dz. rec Please see patient coordinator before you leave today to have your [place] doctor fax [place] all your cxr and CT reports Continue acid suppression and diet as before.  Please schedule a follow up visit in 3 months but call sooner if needed with pft's and cxr    06/18/2013 f/u ov/Wert re:  Chief Complaint  Patient presents with  . Followup  with PFT's    Pt c/o DOE for the past 2 to 3 months. Sats were 80%ra after walking to exam room and did not increase after rest and pursed lip breathing. Sats increased to 99% after pt placed on oxygen 2lpm continuous.    Still can do [facility] [employer]  Can't chase grand children  Urge to clear throat worse since stopped gerd rx, worse day than night   >>PPI/pepcid       08/03/2013 f/u ov/Wert re: PF/ ex hypoxemia Chief Complaint  Patient presents with  . Follow-up    Pt states DOE progressively worse since the last visit.   becoming less active, doesn't actually have any grandchildren to chase around per daughter who thinks her mother is confused.  Having lots of issues with runny nose, need to clear throat day and at hs with cough then  >>chlortrimeton added   08/17/2013 Follow up and Med review  Patient returns for a two-week followup and medication review. We reviewed all her medications and organized them into a medication calendar with patient education. Appears the patient is taking her medications correctly. Daughter helps with her medications as daughter is a Marine scientist. Lab work. Last visit showed a sedimentation rate at 28, BNP at 265. , unrevealing CBC. Daughter does voice some concerns over patient having underlying sleep apnea.  Complains that patient has Snoring and wakes frequently at night . Tired during daytime.  We discussed a sleep study and they're in agreement to Patient does have notable desaturations with ambulation.  She does have normal oxygen saturations at rest. We discussed the initiation of oxygen. However, patient is still hesitant at this time. Would like to think about this more.  rec Sleep Study will  Be set up .  I will call regarding CT chest  Refer for Internal medicine establishment  Follow med calendar closely and bring to each visit.    09/28/2013 f/u ov/Wert re: PF Chief Complaint  Patient presents with  . Follow-up    Pt reports no  improvement of Dyspnea since last OV. Pt states that with any exertion she becomes SOB.   some cough at hs, dry. rec Please see patient coordinator before you leave today  to schedule portable 02 2lpm with activity Try taking the chlortrimeton 2 at bedtime     10/25/2013 f/u ov/Wert re: PF/ noct cough better on chlortrimeton  Chief Complaint  Patient presents with  . Follow-up    Pt states her breathing is unchanged. Not coughing as often.    rec Ok to start zyrtec or clariton daily as per med calendar [place] to adjust 02 to maintain saturations over 90%  rheumatology eval > [place] > rec cellcept/ turned down by Medicaid and can't afford it   12/27/2013 extended f/u ov/Wert re: ILD with new dx PAH and new resp failure 02 at 3 lpm at bedtime and all during the day / no med calendar  Chief Complaint  Patient presents with  . Follow-up    Pt reports that her breathing is unchanged.   no longer able to do shopping at [facility] since March 2015 but not wearing approp 02 (using pulsed system) no longer coughing at all  - some slt L leg swelling but no calf pain  rec Please see patient coordinator before you leave today  to get advanced give you whatever equipment you need to supply 3lpm continuous (not pulsed) with walking and a home health assessment for all your other home need> did not happen Please see patient coordinator before you leave today  to schedule bilateral venous dopplers and a V/Q scan > neg  Please schedule a follow up office visit in 4 weeks, sooner if needed  With updated med calendar in hand > did not do    01/25/2014 f/u ov/Wert re: no med calendar / Chief Complaint  Patient presents with  . Follow-up    Pt has no new complaints.  Has sob w exertion.  needs new handicap placard.   [place]/ [place]   >>  02/16/2014 [facility] follow up  Returns for a post hospital follow up . Admitted 9/13-9/17 for syncopal episode felt secondary to severe PAP out of proportion to ILD .  Marland Kitchen Had [place] with severe PAP  She has a hx positive ANA in setting of ILD ,seen by Rheumatology  was recommended to start on Cellcept however Medicaid denied payment coverage.  She is feeling better since discharge. Seen at Pulmonary HTN clinic by Dr. Haroldine Laws and has been recommended to begin Tyvaso .  Denies chest pain, orthopnea, edema or hemoptysis . No further syncopal episodes.   [place] workup  Sleep study 10/18/13: AHI = 8  VQ 12/28/13: low prob  LE dopplers 12/27/13: negative  PFTs 05/22/13:  FEV1 1.86 (102%)  FVC 2.15 (91%)  FEF 25-75% 2.64 (169%)  DLCO 46%  Echo 12/22/13: LVEF 55%. Septal flattening Grade 1 DD. RV severely dilated with moderate HK. Severe TR. PAP 168mHG  RHC 02/01/14  RA = 12  RV = 94/9/15  PA = 93/28 (53)  PCW = 20  Fick cardiac output/index = 4.3/2.1  PVR = 7.6 WU  FA sat = 98%  PA sat = 59%, 59%   .    Current Medications, Allergies, Complete Past Medical History, Past Surgical History, Family History, and Social History were reviewed in [employer].  ROS  The following are not active complaints unless bolded sore throat, dysphagia, dental problems, itching, sneezing,  nasal congestion or excess/ purulent secretions, ear ache,   fever, chills, sweats, unintended wt loss, pleuritic or exertional cp, hemoptysis,  orthopnea pnd or l  palpitations, heartburn, abdominal pain, anorexia, nausea, vomiting, diarrhea  or change in bowel or urinary habits, change in stools or urine, dysuria,hematuria,  rash, arthralgias, visual complaints, headache, numbness weakness or ataxia or problems with walking or coordination,  change in mood/affect or memory.          Objective:   Physical Exam  10/20/2012         218  > 01/20/2013   219  >   06/18/2013  211  >212 08/03/2013 >213 08/17/2013 > 09/28/2013 209 > 10/25/2013 206 > 12/27/2013  212 > 01/25/2014  204 >205 02/16/2014    HEENT: nl dentition, turbinates, and orophanx. Nl external ear canals without cough  reflex   NECK :  without JVD/Nodes/TM/ nl carotid upstrokes bilaterally   LUNGS: no acc muscle use, diminished BS in bases/ min crackles     CV:  RRR  no s3 or murmur /  Pos increase in P2, no edema   ABD:  soft and nontender with nl excursion in the supine position. 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 5 mm flat nodular density along the right major fissure, likely a subpleural lymph node    Lab Results  Component Value Date   ESRSEDRATE 26* 09/28/2013   ESRSEDRATE 28* 08/03/2013   ESRSEDRATE 43* 06/23/2013     Rheum profile neg ex ANA nucleolar at 1:640      Assessment & Plan:

## 2014-02-16 NOTE — Assessment & Plan Note (Signed)
Cont on o2 .  

## 2014-02-16 NOTE — Patient Instructions (Signed)
Continue on 3l/m continuously .  Continue to follow up with Dr. Haroldine Laws at Pul[facility] Hypertension Clinic .  Follow up Dr. Melvyn Novas  In 6 weeks and As needed

## 2014-02-17 NOTE — Progress Notes (Signed)
Pt's chart/ ov notes  reviewed and agree with a/p

## 2014-02-19 ENCOUNTER — Emergency Department (HOSPITAL_COMMUNITY)
Admission: EM | Admit: 2014-02-19 | Discharge: 2014-03-20 | Disposition: E | Payer: Medicare Other | Attending: Emergency Medicine | Admitting: Emergency Medicine

## 2014-02-19 DIAGNOSIS — J84112 Idiopathic pulmonary fibrosis: Secondary | ICD-10-CM | POA: Insufficient documentation

## 2014-02-19 DIAGNOSIS — Z8639 Personal history of other endocrine, nutritional and metabolic disease: Secondary | ICD-10-CM | POA: Diagnosis not present

## 2014-02-19 DIAGNOSIS — I10 Essential (primary) hypertension: Secondary | ICD-10-CM | POA: Diagnosis not present

## 2014-02-19 DIAGNOSIS — I469 Cardiac arrest, cause unspecified: Secondary | ICD-10-CM | POA: Diagnosis not present

## 2014-02-19 MED ORDER — EPINEPHRINE HCL 0.1 MG/ML IJ SOSY
PREFILLED_SYRINGE | INTRAMUSCULAR | Status: AC | PRN
  Administered 2014-02-19 (×4): 1 mg via INTRAVENOUS

## 2014-02-19 MED ORDER — ATROPINE SULFATE 1 MG/ML IJ SOLN
INTRAMUSCULAR | Status: AC | PRN
  Administered 2014-02-19: 1 mg via INTRAVENOUS

## 2014-02-19 NOTE — ED Notes (Signed)
EMS reports giving pt Epi 8 amps  D50 1 amp PTA to ED

## 2014-02-19 NOTE — Code Documentation (Signed)
Pulse check, no pulse present  Cardiac Monitor PEA rate 34,  No resp effort present, pt remains on vent.

## 2014-02-19 NOTE — Code Documentation (Signed)
Time of death called by Dr. Darl Householder, family remains at bedside.

## 2014-02-19 NOTE — Code Documentation (Signed)
Family at beside. Family given emotional support. 

## 2014-02-19 NOTE — Code Documentation (Signed)
Pt to ED via [facility] with CPR in progress.  EMS reports family st's pt ate dinner then started not feeling well, vomited then became apneic and pulseless

## 2014-02-19 NOTE — Code Documentation (Signed)
Pt's family at bedside, pulse check, no pulse, cardiac monitor PEA.

## 2014-02-19 NOTE — Code Documentation (Signed)
Pulse check, unable to palpate pulse, monitor PEA rate 32

## 2014-02-19 NOTE — ED Notes (Signed)
Assisted with code.

## 2014-02-19 NOTE — Progress Notes (Signed)
Chaplain responded to page from ED.  Chaplain sat with family while trauma team continued to work on pt.  Chaplain remained with family as pt passed.  Chaplain provided spiritual and emotional support as well as the ministry of presence and hospitality.  Chaplain continued to care for family while they spent time with their loved one awaiting more family from out of town.  Family has notified pastor and seems to have good support system.  The pt's daughter was celebrating her birthday today when pt became ill requiring the 911 call.  One of pt's grandsons was still out of town but joined the family via [place].    02/24/2014 2300  Clinical Encounter Type  Visited With Patient and family together;Health care provider  Visit Type Initial;Spiritual support;Social support;Critical Care;Death;Patient actively dying;Trauma  Referral From Nurse  Spiritual Encounters  Spiritual Needs Emotional  Stress Factors  Patient Stress Factors None identified  Family Stress Factors Exhausted;Family relationships;Health changes;Loss;Major life changes   Geralyn Flash

## 2014-02-19 NOTE — Code Documentation (Signed)
Bedside echo by Dr. Darl Householder, with no cardiac motion present

## 2014-02-19 NOTE — ED Provider Notes (Signed)
CSN: [id number]     Arrival date & time 03/12/2014  07/04/2043 History   First MD Initiated Contact with Patient 02/18/2014 2054-07-03     No chief complaint on file.  Patient is a 80 y.o. female presenting with altered mental status. The history is provided by the EMS personnel.  Altered Mental Status Presenting symptoms: unresponsiveness   Most recent episode:  Today Episode history:  Single Timing:  Constant Progression:  Unchanged Chronicity:  New Context comment:  Witnessed arrest at home  Patient is a 80 year old female with history of hypertension, hyperlipidemia and an idiopathic pulmonary fibrosis who presents with CPR in progress. EMS states that the patient was at dinner with her family when she became acutely ill, vomited and then collapsed. 911 was called and CPR was started. Patient received epinephrine in route. Patient was intubated with a 7.0 ET tube. CPR began at 2054/07/03 per EMS. Vomitus was visualized during the intubation. Patient was noted to be in PEA initially and throughout transit to the ED.  No past medical history on file. No past surgical history on file. No family history on file. History  Substance Use Topics  . Smoking status: Not on file  . Smokeless tobacco: Not on file  . Alcohol Use: Not on file   OB History   No data available     Review of Systems  Unable to perform ROS: Intubated    Allergies  Review of patient's allergies indicates not on file.  Home Medications   Prior to Admission medications   Not on File   BP 0/0  Pulse 0  Resp 0  Wt 250 lb (113.399 kg) Physical Exam  Constitutional: She appears well-developed and well-nourished.  Unresponsive, compression device in place  HENT:  Head: Normocephalic and atraumatic.  Pupils fixed and dilated. Vomitus around mouth.  Eyes: Conjunctivae are normal.  No corneal response  Neck: Neck supple. No JVD present.  Cardiovascular: Exam reveals no gallop.   No murmur heard. No carotid, radial or  femoral pulse  Pulmonary/Chest: She has no wheezes. She has no rales.  No spontaneous respirations  Abdominal: She exhibits no distension and no mass.  Musculoskeletal: She exhibits no edema.  atraumatic  Neurological: She exhibits normal muscle tone. GCS eye subscore is 1. GCS verbal subscore is 1. GCS motor subscore is 1.  No posturing  Skin: Skin is warm and dry.  Cool skin, no cyanosis  Psychiatric:  Unresponsive   ED Course  Procedures  MDM   Final diagnoses:  Cardiac arrest   80 year old female presents with CPR in progress. She is unresponsive. A compression device was applied to her chest. She has a 7 ET tube secured in place with bilateral breath sounds and no air sounds over the epigastrium. Her pupils are fixed and dilated. She has no corneal response. Impression is were held and PEA was noted on the monitor. The patient was given 4 rounds of epinephrine with persistent PEA Dr. I spoke with the family and updated him on how her progress. The patient's daughter states that she has become with the patient and she believes that "it is her time." The family was brought to the bedside and the final (fourth) dose of epi was given with no change in her cardiac status. Bedside cardiac echo showed no cardiac activity. Time of death was called at 2108-07-03.  Case discussed with Dr. Darl Householder.     Gustavus Bryant, MD 03/09/2014 [id number]  Gustavus Bryant, MD 03/15/2014 07-03-08

## 2014-02-20 MED FILL — Medication: Qty: 1 | Status: AC

## 2014-02-22 ENCOUNTER — Telehealth: Payer: Self-pay | Admitting: Family Medicine

## 2014-02-22 NOTE — Telephone Encounter (Signed)
Caller name: renee Relation to pt: [facility] Call back number: [phone]   Fax [phone]   Reason for call:   They are needing a copy of the death cert faxed back after it has been signed.  The original will be mailed back.  Thanks.   Call [place] when the DC arrives in office.  Thanks.

## 2014-02-22 NOTE — ED Provider Notes (Signed)
I saw and evaluated the patient, reviewed the resident's note and I agree with the findings and plan.   EKG Interpretation None       Alexa Lawson is a 80 y.o. female hx of interstitial lung disease that is end stage, HTN here with cardiac arrest. She was at daughter's birthday and ate food and vomited. Then became unresponsive. EMS got there and started CPR. Patient intubated in the field. PEA for 45 min. Family wanted patient transported. Continue ACLS. Patient persistently in PEA. ET tube confirmed. Updated daughter. Bedside Echo showed no cardiac activity. Time of death was 13-Jul-2108. I talked to PMD, Dr. Sinclair Ship, covering Dr. Corinna Capra, who will fill out death certificate. Discussed with ME who agreed that its likely from underlying medical issue and doesn't need autopsy.   Cardiopulmonary Resuscitation (CPR) Procedure Note Directed/Performed by: Shirlyn Goltz I personally directed ancillary staff and/or performed CPR in an effort to regain return of spontaneous circulation and to maintain cardiac, neuro and systemic perfusion.    Wandra Arthurs, MD 02/22/14 [phone]

## 2014-02-23 ENCOUNTER — Telehealth: Payer: Self-pay | Admitting: Internal Medicine

## 2014-02-23 NOTE — Telephone Encounter (Signed)
Done

## 2014-02-23 NOTE — Telephone Encounter (Signed)
See below.  They are sending a faxed copy to be signed as well.

## 2014-02-23 NOTE — Telephone Encounter (Signed)
Will forward to Dr. Melvyn Novas and TP so they are aware. ATC daughter NA [facility]

## 2014-02-23 NOTE — Telephone Encounter (Signed)
Send card

## 2014-02-24 NOTE — Telephone Encounter (Signed)
Death Certificate signed by [facility] and faxed back.     KP

## 2014-03-14 ENCOUNTER — Encounter (HOSPITAL_COMMUNITY): Payer: Medicare Other

## 2014-03-16 ENCOUNTER — Ambulatory Visit: Payer: Medicare Other | Admitting: Family Medicine

## 2014-03-20 DEATH — deceased

## 2014-03-30 ENCOUNTER — Ambulatory Visit: Payer: Medicare Other | Admitting: Internal Medicine

## 2014-04-20 ENCOUNTER — Other Ambulatory Visit: Payer: Medicare Other

## 2014-04-22 ENCOUNTER — Ambulatory Visit: Payer: Medicare Other | Admitting: Cardiology

## 2014-04-28 ENCOUNTER — Encounter (HOSPITAL_COMMUNITY): Payer: Self-pay | Admitting: Internal Medicine

## 2014-05-24 ENCOUNTER — Encounter: Payer: Self-pay | Admitting: Family Medicine

## 2014-08-02 IMAGING — CR DG CHEST 2V
2 series · 2 of 2 positions shown · non-contrast
Comparison: Chest x-ray of 06/18/2013 and 10/20/2012

CLINICAL DATA: Pulmonary fibrosis, follow-up, shortness of breath,
former smoking history

EXAM:
CHEST  2 VIEW

[view not recorded (1 of 2)]
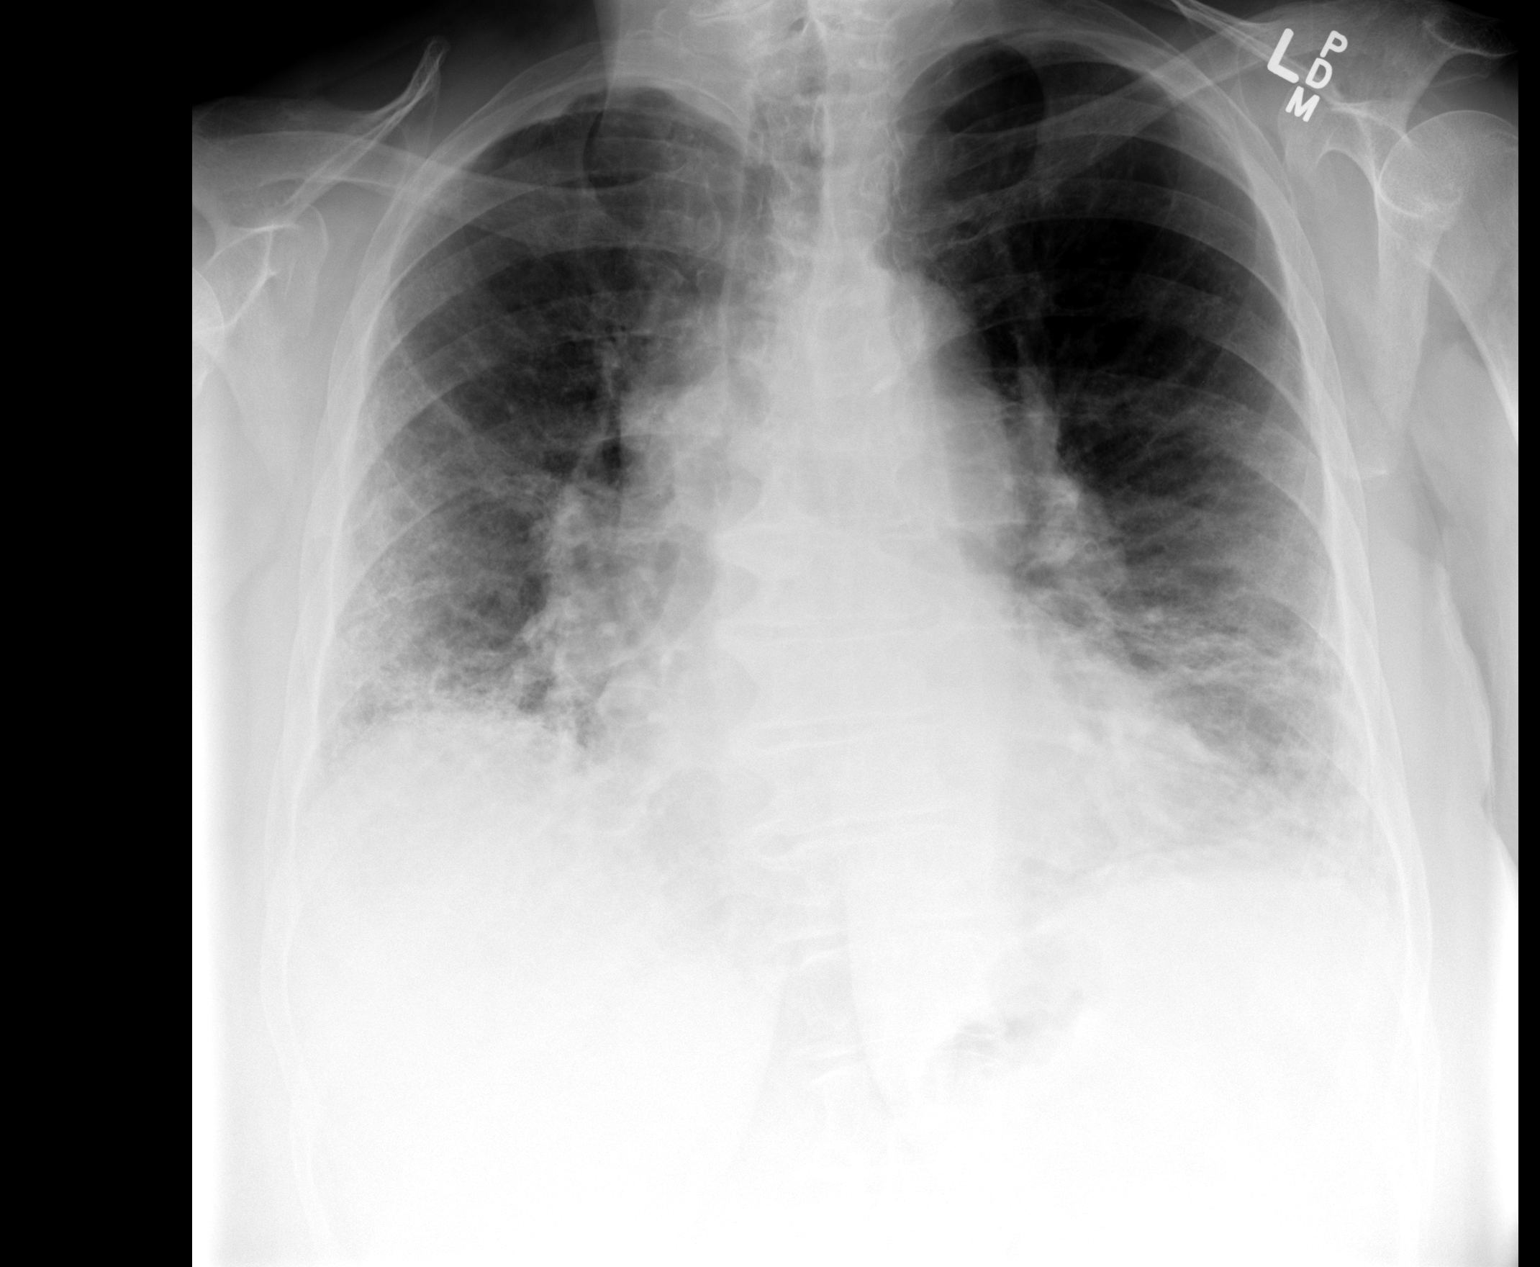

[view not recorded (2 of 2)]
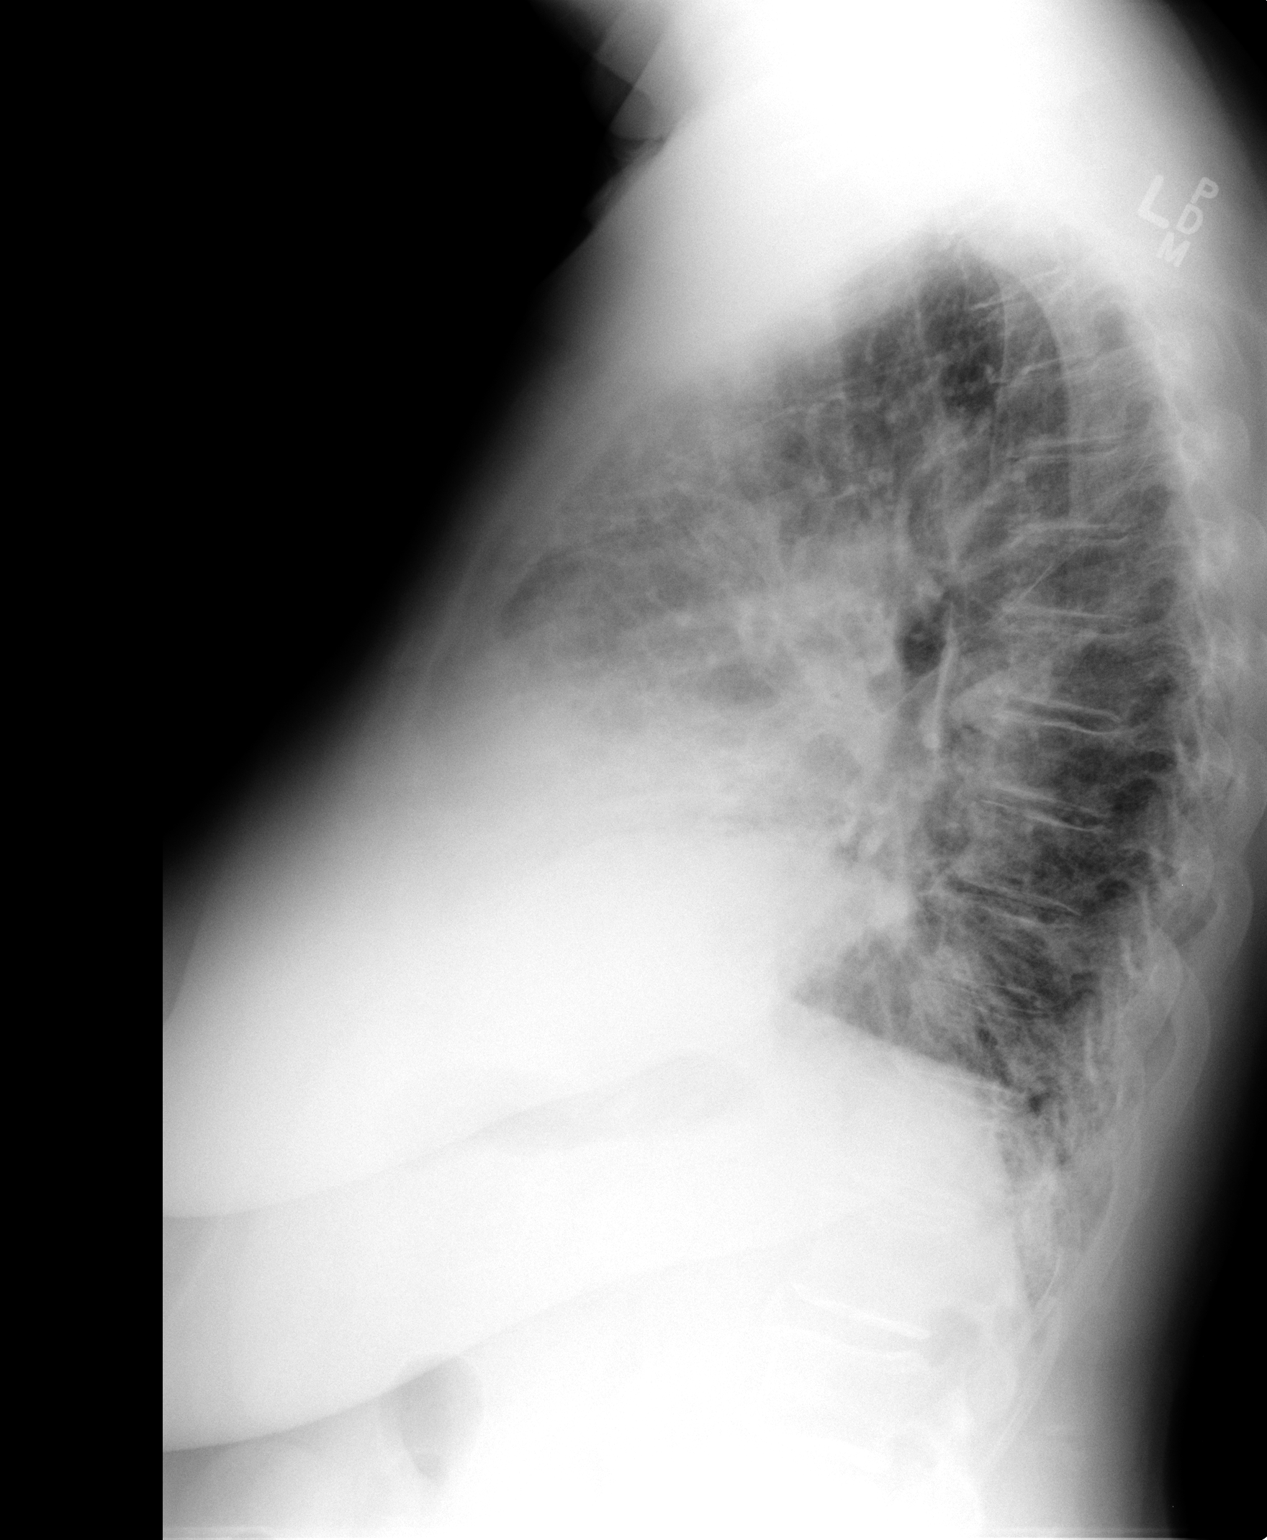

[2 of 2 positions shown; findings below may reference images not displayed]

FINDINGS: There is little change in coarse prominent interstitial markings
primarily at the lung bases consistent with pulmonary fibrosis. No
focal infiltrate or effusion is seen. Hilar contours are stable and
cardiomegaly is stable. There are degenerative changes in the mid to
lower thoracic spine.
IMPRESSION: Pulmonary fibrosis.  No active process.
